# Patient Record
Sex: Male | Born: 1950 | Race: White | Hispanic: No | Marital: Married | Smoking: Never smoker
Health system: Southern US, Community
[De-identification: ages and names within clinical notes are randomized; demographics above are authoritative.]

## PROBLEM LIST (undated history)

## (undated) DIAGNOSIS — I10 Essential (primary) hypertension: Secondary | ICD-10-CM

## (undated) DIAGNOSIS — F32A Depression, unspecified: Secondary | ICD-10-CM

## (undated) DIAGNOSIS — E785 Hyperlipidemia, unspecified: Secondary | ICD-10-CM

## (undated) DIAGNOSIS — Z87442 Personal history of urinary calculi: Secondary | ICD-10-CM

## (undated) DIAGNOSIS — F329 Major depressive disorder, single episode, unspecified: Secondary | ICD-10-CM

## (undated) DIAGNOSIS — G5600 Carpal tunnel syndrome, unspecified upper limb: Secondary | ICD-10-CM

## (undated) DIAGNOSIS — K5792 Diverticulitis of intestine, part unspecified, without perforation or abscess without bleeding: Secondary | ICD-10-CM

## (undated) HISTORY — DX: Hyperlipidemia, unspecified: E78.5

## (undated) HISTORY — PX: ELBOW BURSA SURGERY: SHX615

---

## 1978-12-24 HISTORY — PX: SHOULDER ARTHROSCOPY: SHX128

## 1994-12-24 HISTORY — PX: ANKLE RECONSTRUCTION: SHX1151

## 1999-04-06 ENCOUNTER — Encounter: Payer: Self-pay | Admitting: General Surgery

## 1999-04-07 ENCOUNTER — Ambulatory Visit (HOSPITAL_COMMUNITY): Admission: RE | Admit: 1999-04-07 | Discharge: 1999-04-07 | Payer: Self-pay | Admitting: General Surgery

## 1999-11-22 ENCOUNTER — Emergency Department (HOSPITAL_COMMUNITY): Admission: EM | Admit: 1999-11-22 | Discharge: 1999-11-22 | Payer: Self-pay | Admitting: Podiatry

## 1999-11-22 ENCOUNTER — Encounter: Payer: Self-pay | Admitting: *Deleted

## 2000-03-08 ENCOUNTER — Ambulatory Visit: Admission: RE | Admit: 2000-03-08 | Discharge: 2000-03-08 | Payer: Self-pay

## 2000-12-07 ENCOUNTER — Emergency Department (HOSPITAL_COMMUNITY): Admission: EM | Admit: 2000-12-07 | Discharge: 2000-12-08 | Payer: Self-pay | Admitting: Emergency Medicine

## 2000-12-11 ENCOUNTER — Ambulatory Visit (HOSPITAL_COMMUNITY): Admission: RE | Admit: 2000-12-11 | Discharge: 2000-12-11 | Payer: Self-pay | Admitting: General Surgery

## 2000-12-20 ENCOUNTER — Ambulatory Visit (HOSPITAL_COMMUNITY): Admission: RE | Admit: 2000-12-20 | Discharge: 2000-12-20 | Payer: Self-pay | Admitting: *Deleted

## 2001-11-22 ENCOUNTER — Emergency Department (HOSPITAL_COMMUNITY): Admission: EM | Admit: 2001-11-22 | Discharge: 2001-11-22 | Payer: Self-pay

## 2002-11-12 ENCOUNTER — Encounter: Payer: Self-pay | Admitting: Emergency Medicine

## 2002-11-12 ENCOUNTER — Emergency Department (HOSPITAL_COMMUNITY): Admission: EM | Admit: 2002-11-12 | Discharge: 2002-11-12 | Payer: Self-pay | Admitting: Emergency Medicine

## 2004-02-07 ENCOUNTER — Inpatient Hospital Stay (HOSPITAL_COMMUNITY): Admission: EM | Admit: 2004-02-07 | Discharge: 2004-02-09 | Payer: Self-pay | Admitting: Oncology

## 2004-09-13 ENCOUNTER — Ambulatory Visit (HOSPITAL_COMMUNITY): Admission: RE | Admit: 2004-09-13 | Discharge: 2004-09-13 | Payer: Self-pay | Admitting: Family Medicine

## 2005-09-10 ENCOUNTER — Emergency Department (HOSPITAL_COMMUNITY): Admission: EM | Admit: 2005-09-10 | Discharge: 2005-09-10 | Payer: Self-pay | Admitting: Emergency Medicine

## 2006-04-10 ENCOUNTER — Encounter: Admission: RE | Admit: 2006-04-10 | Discharge: 2006-04-10 | Payer: Self-pay | Admitting: Gastroenterology

## 2011-06-28 ENCOUNTER — Emergency Department (HOSPITAL_COMMUNITY): Payer: 59

## 2011-06-28 ENCOUNTER — Emergency Department (HOSPITAL_COMMUNITY)
Admission: EM | Admit: 2011-06-28 | Discharge: 2011-06-28 | Disposition: A | Discharge status: Home / Self Care | Payer: 59 | Attending: Emergency Medicine | Admitting: Emergency Medicine

## 2011-06-28 DIAGNOSIS — R5381 Other malaise: Secondary | ICD-10-CM | POA: Insufficient documentation

## 2011-06-28 DIAGNOSIS — R6884 Jaw pain: Secondary | ICD-10-CM | POA: Insufficient documentation

## 2011-06-28 DIAGNOSIS — R42 Dizziness and giddiness: Secondary | ICD-10-CM | POA: Insufficient documentation

## 2011-06-28 DIAGNOSIS — R110 Nausea: Secondary | ICD-10-CM | POA: Insufficient documentation

## 2011-06-28 DIAGNOSIS — I10 Essential (primary) hypertension: Secondary | ICD-10-CM | POA: Insufficient documentation

## 2011-06-28 DIAGNOSIS — R5383 Other fatigue: Secondary | ICD-10-CM | POA: Insufficient documentation

## 2011-06-28 DIAGNOSIS — R61 Generalized hyperhidrosis: Secondary | ICD-10-CM | POA: Insufficient documentation

## 2011-06-28 LAB — CBC
MCV: 88.1 fL (ref 78.0–100.0)
Platelets: 198 10*3/uL (ref 150–400)
RDW: 13.6 % (ref 11.5–15.5)
WBC: 7.5 10*3/uL (ref 4.0–10.5)

## 2011-06-28 LAB — CK TOTAL AND CKMB (NOT AT ARMC): Relative Index: 1.9 (ref 0.0–2.5)

## 2011-06-28 LAB — COMPREHENSIVE METABOLIC PANEL
Albumin: 3.9 g/dL (ref 3.5–5.2)
Alkaline Phosphatase: 83 U/L (ref 39–117)
BUN: 16 mg/dL (ref 6–23)
CO2: 28 mEq/L (ref 19–32)
Chloride: 100 mEq/L (ref 96–112)
GFR calc non Af Amer: >60 mL/min (ref >60)
Glucose, Bld: 103 mg/dL — ABNORMAL HIGH (ref 70–99)
Potassium: 3.9 mEq/L (ref 3.5–5.1)
Total Bilirubin: 0.6 mg/dL (ref 0.3–1.2)

## 2011-06-28 LAB — DIFFERENTIAL
Basophils Relative: 0 % (ref 0–1)
Lymphocytes Relative: 12 % (ref 12–46)
Monocytes Absolute: 0.4 10*3/uL (ref 0.1–1.0)
Monocytes Relative: 6 % (ref 3–12)

## 2011-12-21 ENCOUNTER — Encounter (HOSPITAL_COMMUNITY): Payer: Self-pay | Admitting: *Deleted

## 2011-12-21 NOTE — Progress Notes (Signed)
Phone pts home number -left message.

## 2011-12-22 NOTE — H&P (Signed)
Michael Melendez is an 60 y.o. male.   Chief Complaint: RIGHT ELBOW PAIN AND HAND NUMBNESS HPI: GREATER THAN ONE YEAR OF PAIN AND NUMBNESS IN HAND PT ELECTS TO  UNDERGO BELOW PROCEDURES. PT CONCERNED ABOUT NUMBNESS AND DECLINE OF FUNCTION OF THE HAND. +EMG/NCV STUDIES  Past Medical History  Diagnosis Date  . Hypertension   . Gout   . Chronic kidney disease     stones  . Arthritis     r elbow    Past Surgical History  Procedure Date  . Ankle reconstruction 1996  . Shoulder arthroscopy 1980  . Elbow bursa surgery     Family History  Problem Relation Age of Onset  . Anesthesia problems Neg Hx    Social History:  reports that he has never smoked. He does not have any smokeless tobacco history on file. He reports that he drinks about 4.2 ounces of alcohol per week. He reports that he does not use illicit drugs.  Allergies: No Known Allergies  No current facility-administered medications on file as of .   No current outpatient prescriptions on file as of .    No results found for this or any previous visit (from the past 48 hour(s)). No results found.  NO RECENT ILLNESSES OR HOSPITALIZATIONS  There were no vitals taken for this visit. General Appearance:  Alert, cooperative, no distress, appears stated age  Head:  Normocephalic, without obvious abnormality, atraumatic  Eyes:  Pupils equal, conjunctiva/corneas clear,         Throat: Lips, mucosa, and tongue normal; teeth and gums normal  Neck: No visible masses     Lungs:   respirations unlabored  Chest Wall:  No tenderness or deformity  Heart:  Regular rate and rhythm,  Abdomen:   Soft, non-tender,         Extremities: RIGHT ELBOW HEALED ARTHROSCOPIC PORTAL SITES. GOOD ELBOW MOBILITY, SLIGHT ULNAR NERVE SUBLUXATION WITH ELBOW FLEXION. FINGERS WARM WELL PERFUSED. GOOD HAND INTRNISIC STRENGTH.  GOOD MUSCLE TONE  Pulses: 2+ and symmetric  Skin: Skin color, texture, turgor normal, no rashes or lesions     Neurologic:  Normal    Assessment/Plan RIGHT HAND CARPAL TUNNEL RIGHT ELBOW CUBITAL TUNNEL  RIGHT HAND CARPAL TUNNEL RELEASE RIGHT ELBOW ULNAR NERVE RELEASE AND OR TRANSPOSITION  R/B/A DISCUSSED WITH PT IN OFFICE.  PT VOICED UNDERSTANDING OF PLAN CONSENT SIGNED DAY OF SURGERY PT SEEN AND EXAMINED PRIOR TO OPERATIVE PROCEDURE/DAY OF SURGERY SITE MARKED. QUESTIONS ANSWERED WILL GO HOME FOLLOWING SURGERY  Sharma Covert 12/22/2011, 9:25 AM

## 2011-12-23 MED ORDER — CEFAZOLIN SODIUM-DEXTROSE 2-3 GM-% IV SOLR
2.0000 g | INTRAVENOUS | Status: DC
  Filled 2011-12-23: qty 50

## 2011-12-24 ENCOUNTER — Ambulatory Visit (HOSPITAL_COMMUNITY)
Admission: RE | Admit: 2011-12-24 | Discharge: 2011-12-24 | Disposition: A | Discharge status: Home / Self Care | Payer: 59 | Source: Ambulatory Visit | Attending: Orthopedic Surgery | Admitting: Orthopedic Surgery

## 2011-12-24 ENCOUNTER — Encounter (HOSPITAL_COMMUNITY)
Admission: RE | Disposition: A | Discharge status: Home / Self Care | Payer: Self-pay | Source: Ambulatory Visit | Attending: Orthopedic Surgery

## 2011-12-24 ENCOUNTER — Other Ambulatory Visit: Payer: Self-pay

## 2011-12-24 ENCOUNTER — Encounter (HOSPITAL_COMMUNITY): Payer: Self-pay | Admitting: Certified Registered"

## 2011-12-24 DIAGNOSIS — I10 Essential (primary) hypertension: Secondary | ICD-10-CM | POA: Insufficient documentation

## 2011-12-24 DIAGNOSIS — G5620 Lesion of ulnar nerve, unspecified upper limb: Secondary | ICD-10-CM | POA: Insufficient documentation

## 2011-12-24 DIAGNOSIS — M109 Gout, unspecified: Secondary | ICD-10-CM | POA: Insufficient documentation

## 2011-12-24 DIAGNOSIS — G5600 Carpal tunnel syndrome, unspecified upper limb: Secondary | ICD-10-CM | POA: Insufficient documentation

## 2011-12-24 DIAGNOSIS — Z538 Procedure and treatment not carried out for other reasons: Secondary | ICD-10-CM | POA: Insufficient documentation

## 2011-12-24 HISTORY — DX: Essential (primary) hypertension: I10

## 2011-12-24 LAB — BASIC METABOLIC PANEL
BUN: 14 mg/dL (ref 6–23)
CO2: 25 mEq/L (ref 19–32)
Chloride: 105 mEq/L (ref 96–112)
Creatinine, Ser: 1.2 mg/dL (ref 0.50–1.35)
Glucose, Bld: 103 mg/dL — ABNORMAL HIGH (ref 70–99)
Potassium: 4.3 mEq/L (ref 3.5–5.1)

## 2011-12-24 LAB — CBC
HCT: 38.8 % — ABNORMAL LOW (ref 39.0–52.0)
Hemoglobin: 13.6 g/dL (ref 13.0–17.0)
MCV: 89.8 fL (ref 78.0–100.0)
RDW: 13.1 % (ref 11.5–15.5)
WBC: 4.4 10*3/uL (ref 4.0–10.5)

## 2011-12-24 LAB — SURGICAL PCR SCREEN: Staphylococcus aureus: NEGATIVE

## 2011-12-24 SURGERY — CARPAL TUNNEL RELEASE
Anesthesia: General | Laterality: Right

## 2011-12-24 MED ORDER — CHLORHEXIDINE GLUCONATE 4 % EX LIQD: 60.0000 mL | Freq: Once | CUTANEOUS | Status: DC

## 2011-12-24 MED ORDER — MUPIROCIN 2 % EX OINT
TOPICAL_OINTMENT | CUTANEOUS | Status: AC
  Administered 2011-12-24: 1 via NASAL
  Filled 2011-12-24: qty 22

## 2011-12-24 SURGICAL SUPPLY — 40 items
BANDAGE ELASTIC 3 VELCRO ST LF (GAUZE/BANDAGES/DRESSINGS) ×4 IMPLANT
BANDAGE ELASTIC 4 VELCRO ST LF (GAUZE/BANDAGES/DRESSINGS) ×2 IMPLANT
BANDAGE GAUZE ELAST BULKY 4 IN (GAUZE/BANDAGES/DRESSINGS) ×2 IMPLANT
CLOTH BEACON ORANGE TIMEOUT ST (SAFETY) ×2 IMPLANT
CORDS BIPOLAR (ELECTRODE) ×2 IMPLANT
COVER SURGICAL LIGHT HANDLE (MISCELLANEOUS) ×2 IMPLANT
CUFF TOURNIQUET SINGLE 18IN (TOURNIQUET CUFF) ×2 IMPLANT
CUFF TOURNIQUET SINGLE 24IN (TOURNIQUET CUFF) IMPLANT
DRAPE SURG 17X23 STRL (DRAPES) ×2 IMPLANT
EVACUATOR 1/8 PVC DRAIN (DRAIN) IMPLANT
GAUZE XEROFORM 1X8 LF (GAUZE/BANDAGES/DRESSINGS) ×2 IMPLANT
GLOVE BIOGEL PI IND STRL 8.5 (GLOVE) ×1 IMPLANT
GLOVE BIOGEL PI INDICATOR 8.5 (GLOVE) ×1
GLOVE SURG ORTHO 8.0 STRL STRW (GLOVE) ×2 IMPLANT
GOWN PREVENTION PLUS XLARGE (GOWN DISPOSABLE) ×2 IMPLANT
GOWN STRL NON-REIN LRG LVL3 (GOWN DISPOSABLE) ×4 IMPLANT
KIT BASIN OR (CUSTOM PROCEDURE TRAY) ×2 IMPLANT
KIT ROOM TURNOVER OR (KITS) ×2 IMPLANT
LOOP VESSEL MAXI BLUE (MISCELLANEOUS) IMPLANT
NDL HYPO 25GX1X1/2 BEV (NEEDLE) IMPLANT
NEEDLE HYPO 25GX1X1/2 BEV (NEEDLE) IMPLANT
NS IRRIG 1000ML POUR BTL (IV SOLUTION) ×2 IMPLANT
PACK ORTHO EXTREMITY (CUSTOM PROCEDURE TRAY) ×2 IMPLANT
PAD ARMBOARD 7.5X6 YLW CONV (MISCELLANEOUS) ×4 IMPLANT
PAD CAST 4YDX4 CTTN HI CHSV (CAST SUPPLIES) ×2 IMPLANT
PADDING CAST COTTON 4X4 STRL (CAST SUPPLIES) ×4
SOAP 2 % CHG 4 OZ (WOUND CARE) ×2 IMPLANT
SPONGE GAUZE 4X4 12PLY (GAUZE/BANDAGES/DRESSINGS) ×2 IMPLANT
SUCTION FRAZIER TIP 10 FR DISP (SUCTIONS) IMPLANT
SUT PROLENE 4 0 PS 2 18 (SUTURE) ×2 IMPLANT
SUT VIC AB 2-0 CT1 27 (SUTURE)
SUT VIC AB 2-0 CT1 TAPERPNT 27 (SUTURE) IMPLANT
SUT VIC AB 3-0 FS2 27 (SUTURE) IMPLANT
SYR CONTROL 10ML LL (SYRINGE) IMPLANT
SYSTEM CHEST DRAIN TLS 7FR (DRAIN) IMPLANT
TOWEL OR 17X24 6PK STRL BLUE (TOWEL DISPOSABLE) ×2 IMPLANT
TOWEL OR 17X26 10 PK STRL BLUE (TOWEL DISPOSABLE) ×2 IMPLANT
TUBE CONNECTING 12X1/4 (SUCTIONS) IMPLANT
UNDERPAD 30X30 INCONTINENT (UNDERPADS AND DIAPERS) ×2 IMPLANT
WATER STERILE IRR 1000ML POUR (IV SOLUTION) ×2 IMPLANT

## 2011-12-24 NOTE — Anesthesia Preprocedure Evaluation (Addendum)
Anesthesia Evaluation  Patient identified by MRN, date of birth, ID band Patient awake    Reviewed: Allergy & Precautions, H&P , NPO status , Patient's Chart, lab work & pertinent test results  Airway Mallampati: I TM Distance: >3 FB Neck ROM: Full    Dental  (+) Teeth Intact and Dental Advisory Given   Pulmonary neg pulmonary ROS,    Pulmonary exam normal       Cardiovascular hypertension, Pt. on medications regular Normal    Neuro/Psych Negative Neurological ROS  Negative Psych ROS   GI/Hepatic negative GI ROS, Neg liver ROS,   Endo/Other  Negative Endocrine ROS  Renal/GU negative Renal ROS  Genitourinary negative   Musculoskeletal   Abdominal   Peds  Hematology   Anesthesia Other Findings   Reproductive/Obstetrics                         Anesthesia Physical Anesthesia Plan  ASA: II  Anesthesia Plan: General   Post-op Pain Management:    Induction: Intravenous  Airway Management Planned: LMA  Additional Equipment:   Intra-op Plan:   Post-operative Plan: Extubation in OR  Informed Consent: I have reviewed the patients History and Physical, chart, labs and discussed the procedure including the risks, benefits and alternatives for the proposed anesthesia with the patient or authorized representative who has indicated his/her understanding and acceptance.     Plan Discussed with: CRNA, Anesthesiologist and Surgeon  Anesthesia Plan Comments:         Anesthesia Quick Evaluation

## 2011-12-26 ENCOUNTER — Encounter (HOSPITAL_COMMUNITY): Payer: Self-pay | Admitting: Orthopedic Surgery

## 2014-04-19 ENCOUNTER — Other Ambulatory Visit: Payer: Self-pay | Admitting: Gastroenterology

## 2014-07-05 ENCOUNTER — Encounter (HOSPITAL_COMMUNITY): Payer: Self-pay | Admitting: Pharmacy Technician

## 2014-07-05 ENCOUNTER — Encounter (HOSPITAL_COMMUNITY): Payer: Self-pay | Admitting: *Deleted

## 2014-07-27 ENCOUNTER — Encounter (HOSPITAL_COMMUNITY): Payer: 59 | Admitting: Anesthesiology

## 2014-07-27 ENCOUNTER — Encounter (HOSPITAL_COMMUNITY)
Admission: RE | Disposition: A | Discharge status: Home / Self Care | Payer: Self-pay | Source: Ambulatory Visit | Attending: Gastroenterology

## 2014-07-27 ENCOUNTER — Ambulatory Visit (HOSPITAL_COMMUNITY): Payer: 59 | Admitting: Anesthesiology

## 2014-07-27 ENCOUNTER — Ambulatory Visit (HOSPITAL_COMMUNITY)
Admission: RE | Admit: 2014-07-27 | Discharge: 2014-07-27 | Disposition: A | Discharge status: Home / Self Care | Payer: 59 | Source: Ambulatory Visit | Attending: Gastroenterology | Admitting: Gastroenterology

## 2014-07-27 ENCOUNTER — Encounter (HOSPITAL_COMMUNITY): Payer: Self-pay | Admitting: *Deleted

## 2014-07-27 DIAGNOSIS — Z1211 Encounter for screening for malignant neoplasm of colon: Secondary | ICD-10-CM | POA: Insufficient documentation

## 2014-07-27 DIAGNOSIS — I10 Essential (primary) hypertension: Secondary | ICD-10-CM | POA: Insufficient documentation

## 2014-07-27 DIAGNOSIS — M109 Gout, unspecified: Secondary | ICD-10-CM | POA: Insufficient documentation

## 2014-07-27 DIAGNOSIS — K620 Anal polyp: Secondary | ICD-10-CM | POA: Insufficient documentation

## 2014-07-27 DIAGNOSIS — E7800 Pure hypercholesterolemia, unspecified: Secondary | ICD-10-CM | POA: Insufficient documentation

## 2014-07-27 DIAGNOSIS — K621 Rectal polyp: Secondary | ICD-10-CM

## 2014-07-27 HISTORY — DX: Personal history of urinary calculi: Z87.442

## 2014-07-27 HISTORY — DX: Depression, unspecified: F32.A

## 2014-07-27 HISTORY — DX: Major depressive disorder, single episode, unspecified: F32.9

## 2014-07-27 HISTORY — DX: Carpal tunnel syndrome, unspecified upper limb: G56.00

## 2014-07-27 HISTORY — PX: COLONOSCOPY WITH PROPOFOL: SHX5780

## 2014-07-27 SURGERY — COLONOSCOPY WITH PROPOFOL
Anesthesia: Monitor Anesthesia Care

## 2014-07-27 MED ORDER — LIDOCAINE HCL (CARDIAC) 20 MG/ML IV SOLN
INTRAVENOUS | Status: AC
  Filled 2014-07-27: qty 5

## 2014-07-27 MED ORDER — PROPOFOL INFUSION 10 MG/ML OPTIME
INTRAVENOUS | Status: DC | PRN
  Administered 2014-07-27: 120 ug/kg/min via INTRAVENOUS

## 2014-07-27 MED ORDER — LACTATED RINGERS IV SOLN
INTRAVENOUS | Status: DC
Start: 2014-07-27 — End: 2014-07-27

## 2014-07-27 MED ORDER — PROPOFOL 10 MG/ML IV EMUL
INTRAVENOUS | Status: DC | PRN
  Administered 2014-07-27: 40 mg via INTRAVENOUS

## 2014-07-27 MED ORDER — PROPOFOL 10 MG/ML IV BOLUS
INTRAVENOUS | Status: AC
  Filled 2014-07-27: qty 20

## 2014-07-27 MED ORDER — LACTATED RINGERS IV SOLN
INTRAVENOUS | Status: DC | PRN
  Administered 2014-07-27: 08:00:00 via INTRAVENOUS

## 2014-07-27 SURGICAL SUPPLY — 22 items

## 2014-07-27 NOTE — Transfer of Care (Signed)
Immediate Anesthesia Transfer of Care Note  Patient: Michael Melendez  Procedure(s) Performed: Procedure(s): COLONOSCOPY WITH PROPOFOL (N/A)  Patient Location: PACU  Anesthesia Type:MAC  Level of Consciousness: awake, alert  and oriented  Airway & Oxygen Therapy: Patient Spontanous Breathing and Patient connected to face mask oxygen  Post-op Assessment: Report given to PACU RN and Post -op Vital signs reviewed and stable  Post vital signs: Reviewed and stable  Complications: No apparent anesthesia complications

## 2014-07-27 NOTE — Discharge Instructions (Signed)
Colonoscopy, Care After °These instructions give you information on caring for yourself after your procedure. Your doctor may also give you more specific instructions. Call your doctor if you have any problems or questions after your procedure. °HOME CARE °· Do not drive for 24 hours. °· Do not sign important papers or use machinery for 24 hours. °· You may shower. °· You may go back to your usual activities, but go slower for the first 24 hours. °· Take rest breaks often during the first 24 hours. °· Walk around or use warm packs on your belly (abdomen) if you have belly cramping or gas. °· Drink enough fluids to keep your pee (urine) clear or pale yellow. °· Resume your normal diet. Avoid heavy or fried foods. °· Avoid drinking alcohol for 24 hours or as told by your doctor. °· Only take medicines as told by your doctor. °If a tissue sample (biopsy) was taken during the procedure:  °· Do not take aspirin or blood thinners for 7 days, or as told by your doctor. °· Do not drink alcohol for 7 days, or as told by your doctor. °· Eat soft foods for the first 24 hours. °GET HELP IF: °You still have a small amount of blood in your poop (stool) 2-3 days after the procedure. °GET HELP RIGHT AWAY IF: °· You have more than a small amount of blood in your poop. °· You see clumps of tissue (blood clots) in your poop. °· Your belly is puffy (swollen). °· You feel sick to your stomach (nauseous) or throw up (vomit). °· You have a fever. °· You have belly pain that gets worse and medicine does not help. °MAKE SURE YOU: °· Understand these instructions. °· Will watch your condition. °· Will get help right away if you are not doing well or get worse. °Document Released: 01/12/2011 Document Revised: 12/15/2013 Document Reviewed: 08/17/2013 °[employer]® Patient Information ©2015 [employer]. This information is not intended to replace advice given to you by your health care provider. Make sure you discuss any questions you have with  your health care provider. ° °

## 2014-07-27 NOTE — Anesthesia Postprocedure Evaluation (Signed)
  Anesthesia Post-op Note  Patient: Michael Melendez  Procedure(s) Performed: Procedure(s): COLONOSCOPY WITH PROPOFOL (N/A)  Patient Location: PACU  Anesthesia Type:MAC  Level of Consciousness: awake, alert  and oriented  Airway and Oxygen Therapy: Patient Spontanous Breathing  Post-op Pain: none  Post-op Assessment: Post-op Vital signs reviewed  Post-op Vital Signs: Reviewed  Last Vitals:  Filed Vitals:   07/27/14 0930  BP: 126/87  Pulse: 54  Temp:   Resp: 15    Complications: No apparent anesthesia complications

## 2014-07-27 NOTE — Anesthesia Preprocedure Evaluation (Addendum)
Anesthesia Evaluation  Patient identified by MRN, date of birth, ID band Patient awake    Reviewed: Allergy & Precautions, H&P , NPO status , Patient's Chart, lab work & pertinent test results  History of Anesthesia Complications Negative for: history of anesthetic complications  Airway Mallampati: II TM Distance: >3 FB Neck ROM: Full    Dental  (+) Teeth Intact, Dental Advisory Given   Pulmonary neg pulmonary ROS,  breath sounds clear to auscultation        Cardiovascular hypertension, negative cardio ROS  Rhythm:Regular Rate:Normal     Neuro/Psych PSYCHIATRIC DISORDERS Depression negative neurological ROS     GI/Hepatic negative GI ROS, Neg liver ROS,   Endo/Other  negative endocrine ROS  Renal/GU negative Renal ROS  negative genitourinary   Musculoskeletal negative musculoskeletal ROS (+)   Abdominal   Peds  Hematology negative hematology ROS (+)   Anesthesia Other Findings   Reproductive/Obstetrics negative OB ROS                          Anesthesia Physical Anesthesia Plan  ASA: I  Anesthesia Plan: MAC   Post-op Pain Management:    Induction: Intravenous  Airway Management Planned: Simple Face Mask  Additional Equipment: None  Intra-op Plan:   Post-operative Plan:   Informed Consent: I have reviewed the patients History and Physical, chart, labs and discussed the procedure including the risks, benefits and alternatives for the proposed anesthesia with the patient or authorized representative who has indicated his/her understanding and acceptance.   Dental advisory given  Plan Discussed with: CRNA  Anesthesia Plan Comments:        Anesthesia Quick Evaluation

## 2014-07-27 NOTE — Op Note (Signed)
Procedure: Screening colonoscopy. Normal screening colonoscopy performed 12/20/2000  Endoscopist: Earle Gell  Premedication: Propofol administered by anesthesia  Procedure: The patient was placed in the left lateral decubitus position. Anal inspection and digital rectal exam were normal. The Pentax pediatric colonoscope was introduced into the rectum and advanced to the cecum. A normal-appearing ileocecal valve and appendiceal orifice were identified. Colonic preparation for the exam today was good.  Rectum. A 5 mm sessile polyp was removed from the proximal rectum with the cold snare and submitted for pathological interpretation. Retroflexed view of the distal rectum was normal  Sigmoid colon and descending colon. Left colonic diverticulosis  Splenic flexure. Normal  Transverse colon. Normal  Hepatic flexure. Normal  Ascending colon. Normal  Cecum and ileocecal valve. Normal  Assessment: A 5 mm sessile polyp was removed from the proximal rectum with the cold snare and submitted for pathological interpretation; otherwise normal  Recommendation: If rectal polyp returns neoplastic pathologically, the patient should undergo a surveillance colonoscopy in 5 years. If the polyp returns nonneoplastic pathologically, she should undergo a repeat screening colonoscopy in 10 years

## 2014-07-27 NOTE — H&P (Signed)
  Procedure: Screening colonoscopy. Normal screening colonoscopy performed 12/20/2000  History: The patient is a 63 year old male born May 30, 1951. He underwent a normal baseline screening colonoscopy on 12/20/2000. He is scheduled to undergo a repeat screening colonoscopy today.  Medication allergies: None  Past medical and surgical history: Ankle surgery. Right elbow surgery. Rotator cuff surgery. Hypercholesterolemia. Kidney stones. Gout. Hypertension.  Exam: The patient is alert and lying comfortably on the endoscopy stretcher. Lungs are clear to auscultation. Cardiac exam reveals a regular rhythm. Abdomen is soft and nontender to palpation.  Plan: Proceed with screening colonoscopy

## 2014-07-28 ENCOUNTER — Encounter (HOSPITAL_COMMUNITY): Payer: Self-pay | Admitting: Gastroenterology

## 2015-01-06 ENCOUNTER — Encounter (HOSPITAL_COMMUNITY): Payer: Self-pay | Admitting: Orthopedic Surgery

## 2015-08-04 ENCOUNTER — Ambulatory Visit: Payer: Medicare Other | Admitting: Podiatry

## 2015-12-07 ENCOUNTER — Ambulatory Visit (INDEPENDENT_AMBULATORY_CARE_PROVIDER_SITE_OTHER): Payer: Medicare Other | Admitting: Podiatry

## 2015-12-07 ENCOUNTER — Encounter: Payer: Self-pay | Admitting: Podiatry

## 2015-12-07 VITALS — BP 157/87 | HR 58 | Resp 12

## 2015-12-07 DIAGNOSIS — B351 Tinea unguium: Secondary | ICD-10-CM | POA: Diagnosis not present

## 2015-12-07 NOTE — Progress Notes (Addendum)
   Subjective:    Patient ID: Michael Melendez, male    DOB: October 25, 1951, 64 y.o.   MRN: [id number]  HPI this patient presents the office with chief complaint of a discolored and gross toenail left big toe. He states he has lived with this nail for years and he has no pain or discomfort from the nails is at the point, He wants to determine what to do cosmetically to help to eliminate the nail. He states he has been on Lamisil previously and there was no benefit. He does relate that the nail has been injured and has fallen off at least 3 times .  He presents to the office to discuss his toenail  The patient presents here with left great and 2nd toe discoloration.  Review of Systems  All other systems reviewed and are negative.      Objective:   Physical Exam GENERAL APPEARANCE: Alert, conversant. Appropriately groomed. No acute distress.  VASCULAR: Pedal pulses palpable at  [facility] and PT bilateral.  Capillary refill time is immediate to all digits,  Normal temperature gradient.  Digital hair growth is present bilateral  NEUROLOGIC: sensation is normal to 5.07 monofilament at 5/5 sites bilateral.  Light touch is intact bilateral, Muscle strength normal.  MUSCULOSKELETAL: acceptable muscle strength, tone and stability bilateral.  Intrinsic muscluature intact bilateral.  Rectus appearance of foot and digits noted bilateral.   DERMATOLOGIC: skin color, texture, and turgor are within normal limits.  No preulcerative lesions or ulcers  are seen, no interdigital maceration noted.  No open lesions present.  Digital nails are asymptomatic. No drainage noted. NAILS  Yellow, flaking disfigured toenail on left nail bed which grows about 40% from nail fold. No signs of redness or swelling or pain noted.        Assessment & Plan:  Onychomycosis Left hallux.  IE>  Discussed conservative vs. Surgical treatment of the nail.  RTC prn  Gardiner Barefoot DPM

## 2017-01-20 ENCOUNTER — Emergency Department (HOSPITAL_COMMUNITY): Payer: Medicare Other

## 2017-01-20 ENCOUNTER — Emergency Department (HOSPITAL_COMMUNITY)
Admission: EM | Admit: 2017-01-20 | Discharge: 2017-01-20 | Disposition: A | Discharge status: Home / Self Care | Payer: Medicare Other | Attending: Emergency Medicine | Admitting: Emergency Medicine

## 2017-01-20 ENCOUNTER — Encounter (HOSPITAL_COMMUNITY): Payer: Self-pay | Admitting: Emergency Medicine

## 2017-01-20 DIAGNOSIS — N202 Calculus of kidney with calculus of ureter: Secondary | ICD-10-CM | POA: Diagnosis not present

## 2017-01-20 DIAGNOSIS — I10 Essential (primary) hypertension: Secondary | ICD-10-CM | POA: Insufficient documentation

## 2017-01-20 DIAGNOSIS — N200 Calculus of kidney: Secondary | ICD-10-CM

## 2017-01-20 DIAGNOSIS — R109 Unspecified abdominal pain: Secondary | ICD-10-CM | POA: Diagnosis present

## 2017-01-20 DIAGNOSIS — R1031 Right lower quadrant pain: Secondary | ICD-10-CM | POA: Diagnosis not present

## 2017-01-20 LAB — COMPREHENSIVE METABOLIC PANEL
ALT: 25 U/L (ref 17–63)
AST: 29 U/L (ref 15–41)
Albumin: 4.5 g/dL (ref 3.5–5.0)
Alkaline Phosphatase: 70 U/L (ref 38–126)
Anion gap: 8 (ref 5–15)
BILIRUBIN TOTAL: 1.4 mg/dL — AB (ref 0.3–1.2)
BUN: 20 mg/dL (ref 6–20)
CALCIUM: 9.6 mg/dL (ref 8.9–10.3)
CO2: 25 mmol/L (ref 22–32)
CREATININE: 1.63 mg/dL — AB (ref 0.61–1.24)
Chloride: 107 mmol/L (ref 101–111)
GFR calc Af Amer: 49 mL/min — ABNORMAL LOW (ref >60)
GFR, EST NON AFRICAN AMERICAN: 42 mL/min — AB (ref >60)
GLUCOSE: 116 mg/dL — AB (ref 65–99)
Potassium: 3.9 mmol/L (ref 3.5–5.1)
Sodium: 140 mmol/L (ref 135–145)
Total Protein: 7.3 g/dL (ref 6.5–8.1)

## 2017-01-20 LAB — URINALYSIS, ROUTINE W REFLEX MICROSCOPIC
BILIRUBIN URINE: NEGATIVE
GLUCOSE, UA: NEGATIVE mg/dL
KETONES UR: NEGATIVE mg/dL
LEUKOCYTES UA: NEGATIVE
NITRITE: NEGATIVE
PH: 5 (ref 5.0–8.0)
PROTEIN: NEGATIVE mg/dL
Specific Gravity, Urine: 1.018 (ref 1.005–1.030)

## 2017-01-20 LAB — CBC
HEMATOCRIT: 39.7 % (ref 39.0–52.0)
HEMOGLOBIN: 14 g/dL (ref 13.0–17.0)
MCH: 30.9 pg (ref 26.0–34.0)
MCHC: 35.3 g/dL (ref 30.0–36.0)
MCV: 87.6 fL (ref 78.0–100.0)
Platelets: 211 10*3/uL (ref 150–400)
RBC: 4.53 MIL/uL (ref 4.22–5.81)
RDW: 13 % (ref 11.5–15.5)
WBC: 4.4 10*3/uL (ref 4.0–10.5)

## 2017-01-20 LAB — LIPASE, BLOOD: LIPASE: 28 U/L (ref 11–51)

## 2017-01-20 MED ORDER — TAMSULOSIN HCL 0.4 MG PO CAPS: 0.4000 mg | ORAL_CAPSULE | Freq: Every day | ORAL | 0 refills | Status: DC

## 2017-01-20 MED ORDER — OXYCODONE HCL 5 MG PO TABS: 5.0000 mg | ORAL_TABLET | Freq: Four times a day (QID) | ORAL | 0 refills | Status: DC | PRN

## 2017-01-20 MED ORDER — KETOROLAC TROMETHAMINE 30 MG/ML IJ SOLN
30.0000 mg | Freq: Once | INTRAMUSCULAR | Status: AC
  Administered 2017-01-20: 30 mg via INTRAVENOUS
  Filled 2017-01-20: qty 1

## 2017-01-20 MED ORDER — HYDROCODONE-ACETAMINOPHEN 5-325 MG PO TABS: 2.0000 | ORAL_TABLET | ORAL | 0 refills | Status: DC | PRN

## 2017-01-20 MED ORDER — ONDANSETRON HCL 4 MG PO TABS: 4.0000 mg | ORAL_TABLET | Freq: Four times a day (QID) | ORAL | 0 refills | Status: DC

## 2017-01-20 NOTE — ED Notes (Signed)
Pt given urinal and made aware of need for urine specimen 

## 2017-01-20 NOTE — ED Provider Notes (Signed)
Emergency Department Provider Note   I have reviewed the triage vital signs and the nursing notes.   HISTORY  Chief Complaint Flank Pain   HPI Michael Melendez is a 66 y.o. male with PMH of gout and history of kidney stones presents to the emergency department for evaluation of sudden onset right flank pain. Patient states he's had some mild discomfort in this area for the past several weeks. Today he was playing racquetball when his pain suddenly worsened. He felt as if it was radiating from his back around to his flank. He has a past history of kidney stones states the pain was similar but not quite as severe as prior episodes. Patient reports a history of multiple stones in his 32s one of which required lithotripsy and his right ureter was damaged during the procedure. He denies any hematuria. No fevers or chills. No chest pain or difficulty breathing. No exacerbating or alleviating factors.  Past Medical History:  Diagnosis Date  . Carpal tunnel syndrome    on right pt says surgery 12-24-11 never occurred  . Depression   . Gout   . History of kidney stones    none in years  . Hypertension     There are no active problems to display for this patient.   Past Surgical History:  Procedure Laterality Date  . ANKLE RECONSTRUCTION Bilateral 1996  . COLONOSCOPY WITH PROPOFOL N/A 07/27/2014   Procedure: COLONOSCOPY WITH PROPOFOL;  Surgeon: Garlan Fair, MD;  Location: [facility];  Service: Endoscopy;  Laterality: N/A;  . ELBOW BURSA SURGERY Right   . SHOULDER ARTHROSCOPY Bilateral 1980   rotator cuff repair    Current Outpatient Rx  . Order #: [id number] Class: Historical Med  . Order #: [id number] Class: Historical Med  . Order #: [id number] Class: Historical Med  . Order #: [id number] Class: Print  . Order #: [id number] Class: Print  . Order #: [id number] Class: Historical Med  . Order #: [id number] Class: Print  . Order #: [id number] Class: Historical Med    Allergies Patient has no  known allergies.  Family History  Problem Relation Age of Onset  . Anesthesia problems Neg Hx     Social History Social History  Substance Use Topics  . Smoking status: Never Smoker  . Smokeless tobacco: Never Used  . Alcohol use 4.2 oz/week    7 Glasses of wine per week     Comment: wine 1/2 bottle  5 days per week    Review of Systems  Constitutional: No fever/chills Eyes: No visual changes. ENT: No sore throat. Cardiovascular: Denies chest pain. Respiratory: Denies shortness of breath. Gastrointestinal: No abdominal pain.  No nausea, no vomiting.  No diarrhea.  No constipation. Genitourinary: Negative for dysuria. Positive intermittent right flank pain.  Musculoskeletal: Negative for back pain. Skin: Negative for rash. Neurological: Negative for headaches, focal weakness or numbness.  10-point ROS otherwise negative.  ____________________________________________   PHYSICAL EXAM:  VITAL SIGNS: ED Triage Vitals  Enc Vitals Group     BP 01/20/17 1008 140/95     Pulse Rate 01/20/17 1008 108     Resp 01/20/17 1008 18     Temp 01/20/17 1008 97.6 F (36.4 C)     Temp Source 01/20/17 1008 Oral     SpO2 01/20/17 1008 98 %     Pain Score 01/20/17 1010 10   Constitutional: Alert and oriented. Well appearing and in no acute distress. Eyes: Conjunctivae are normal.  Head: Atraumatic. Nose: No congestion/rhinnorhea. Mouth/Throat: Mucous membranes are  moist.   Neck: No stridor.   Cardiovascular: Normal rate, regular rhythm. Good peripheral circulation. Grossly normal heart sounds.   Respiratory: Normal respiratory effort.  No retractions. Lungs CTAB. Gastrointestinal: Soft and nontender. No distention. Mild right CVA tenderness.  Musculoskeletal: No lower extremity tenderness nor edema. No gross deformities of extremities. Neurologic:  Normal speech and language. No gross focal neurologic deficits are appreciated.  Skin:  Skin is warm, dry and intact. No rash  noted. Psychiatric: Mood and affect are normal. Speech and behavior are normal.  ____________________________________________   LABS (all labs ordered are listed, but only abnormal results are displayed)  Labs Reviewed  COMPREHENSIVE METABOLIC PANEL - Abnormal; Notable for the following:       Result Value   Glucose, Bld 116 (*)    Creatinine, Ser 1.63 (*)    Total Bilirubin 1.4 (*)    GFR calc non Af Amer 42 (*)    GFR calc Af Amer 49 (*)    All other components within normal limits  URINALYSIS, ROUTINE W REFLEX MICROSCOPIC - Abnormal; Notable for the following:    Hgb urine dipstick MODERATE (*)    Bacteria, UA RARE (*)    Squamous Epithelial / LPF 0-5 (*)    All other components within normal limits  LIPASE, BLOOD  CBC   ____________________________________________  RADIOLOGY  Ct Renal Stone Study  Result Date: 01/20/2017 CLINICAL DATA:  Right flank pain, right lower quadrant pain EXAM: CT ABDOMEN AND PELVIS WITHOUT CONTRAST TECHNIQUE: Multidetector CT imaging of the abdomen and pelvis was performed following the standard protocol without IV contrast. COMPARISON:  None. FINDINGS: Lower chest: Lung bases are clear. No effusions. Heart is normal size. Hepatobiliary: No focal hepatic abnormality. Gallbladder unremarkable. Pancreas: No focal abnormality or ductal dilatation. Spleen: No focal abnormality.  Normal size. Adrenals/Urinary Tract: Punctate nonobstructing stones in the upper and lower poles of the right kidney. 4 mm stone noted at the pelvic brim within the mid right ureter. No hydronephrosis. Adrenal glands and urinary bladder unremarkable. Stomach/Bowel: Sigmoid diverticulosis. No active diverticulitis. Appendix is normal. Stomach and small bowel decompressed, unremarkable. Vascular/Lymphatic: Aortic and iliac calcifications. No aneurysm or adenopathy. Reproductive: No visible focal abnormality. Other: No free fluid or free air. Small bilateral inguinal hernias containing  fat. Musculoskeletal: No acute bony abnormality. IMPRESSION: 4 mm mid right ureteral stone at the pelvic brim no evidence of hydronephrosis. Punctate right nephrolithiasis. Sigmoid diverticulosis. Electronically Signed   By: Rolm Baptise M.D.   On: 01/20/2017 11:14    ____________________________________________   PROCEDURES  Procedure(s) performed:   Procedures  None ____________________________________________   INITIAL IMPRESSION / ASSESSMENT AND PLAN / ED COURSE  Pertinent labs & imaging results that were available during my care of the patient were reviewed by me and considered in my medical decision making (see chart for details).  Patient resents to emergency department for evaluation of right flank pain that began suddenly during racquetball. He's had several weeks of symptoms but they became acutely worse today. Clinical suspicion is elevated for kidney stone. Lower suspicion for vascular etiology given the pain being on the right side is post the left. Abdomen is soft nontender. Does have some mild CVA tenderness. Plan for labs, urinalysis, renal CT and reassessment.  12:03 PM  Patient with a 4 mm nonobstructing stone in the right ureter. Otherwise CT scan is unremarkable. Patient's pain is well-controlled. He does have a slight increase in his creatinine with normal BUN. Otherwise labs are normal. Plan for  pain control and Flomax. He has had a urologist and we will call tomorrow and schedule a follow-up appointment given his history of cough located stones. Discussed return precautions for development of infection symptoms or intractable pain. Patient is comfortable with the plan at discharge.  At this time, I do not feel there is any life-threatening condition present. I have reviewed and discussed all results (EKG, imaging, lab, urine as appropriate), exam findings with patient. I have reviewed nursing notes and appropriate previous records.  I feel the patient is safe to be  discharged home without further emergent workup. Discussed usual and customary return precautions. Patient and family (if present) verbalize understanding and are comfortable with this plan.  Patient will follow-up with their primary care provider. If they do not have a primary care provider, information for follow-up has been provided to them. All questions have been answered.  ____________________________________________  FINAL CLINICAL IMPRESSION(S) / ED DIAGNOSES  Final diagnoses:  Right flank pain  Kidney stone     MEDICATIONS GIVEN DURING THIS VISIT:  Medications  ketorolac (TORADOL) 30 MG/ML injection 30 mg (30 mg Intravenous Given 01/20/17 1055)     NEW OUTPATIENT MEDICATIONS STARTED DURING THIS VISIT:  Discharge Medication List as of 01/20/2017 12:07 PM    START taking these medications   Details  ondansetron (ZOFRAN) 4 MG tablet Take 1 tablet (4 mg total) by mouth every 6 (six) hours., Starting Sun 01/20/2017, Print    HYDROcodone-acetaminophen (NORCO/VICODIN) 5-325 MG tablet Take 2 tablets by mouth every 4 (four) hours as needed., Starting Sun 01/20/2017, Print    tamsulosin (FLOMAX) 0.4 MG CAPS capsule Take 1 capsule (0.4 mg total) by mouth daily., Starting Sun 01/20/2017, Print          Note:  This document was prepared using Dragon voice recognition software and may include unintentional dictation errors.  Nanda Quinton, MD Emergency Medicine   Margette Fast, MD 01/20/17 [phone]

## 2017-01-20 NOTE — ED Notes (Signed)
Patient transported to CT 

## 2017-01-20 NOTE — ED Triage Notes (Signed)
Pt c/o right flank pain radiating to RLQ x several days, pain worsened and moved caudally today after playing sports. No emesis, diarrhea. No urinary symptoms.

## 2017-01-20 NOTE — Discharge Instructions (Signed)
You have been seen in the Emergency Department (ED) today for pain that we believe based on your workup, is caused by kidney stones.  As we have discussed, please drink plenty of fluids.  Please make a follow up appointment with the physician(s) listed elsewhere in this documentation. ° °You may take pain medication as needed but ONLY as prescribed.  Please also take your prescribed Flomax daily.  We also recommend that you take over-the-counter ibuprofen regularly according to label instructions over the next 5 days.  Take it with meals to minimize stomach discomfort. ° °Please see your doctor as soon as possible as stones may take 1-3 weeks to pass and you may require additional care or medications. ° °Do not drink alcohol, drive or participate in any other potentially dangerous activities while taking opiate pain medication as it may make you sleepy. Do not take this medication with any other sedating medications, either prescription or over-the-counter. If you were prescribed Percocet or Vicodin, do not take these with acetaminophen (Tylenol) as it is already contained within these medications. °  °Take Vicodin as needed for severe pain.  This medication is an opiate (or narcotic) pain medication and can be habit forming.  Use it as little as possible to achieve adequate pain control.  Do not use or use it with extreme caution if you have a history of opiate abuse or dependence.  If you are on a pain contract with your primary care doctor or a pain specialist, be sure to let them know you were prescribed this medication today from the Emergency Department.  This medication is intended for your use only - do not give any to anyone else and keep it in a secure place where nobody else, especially children, have access to it.  It will also cause or worsen constipation, so you may want to consider taking an over-the-counter stool softener while you are taking this medication. ° °Return to the Emergency Department  (ED) or call your doctor if you have any worsening pain, fever, painful urination, are unable to urinate, or develop other symptoms that concern you. ° ° ° °Kidney Stones °Kidney stones (urolithiasis) are deposits that form inside your kidneys. The intense pain is caused by the stone moving through the urinary tract. When the stone moves, the ureter goes into spasm around the stone. The stone is usually passed in the urine.  °CAUSES  °A disorder that makes certain neck glands produce too much parathyroid hormone (primary hyperparathyroidism). °A buildup of uric acid crystals, similar to gout in your joints. °Narrowing (stricture) of the ureter. °A kidney obstruction present at birth (congenital obstruction). °Previous surgery on the kidney or ureters. °Numerous kidney infections. °SYMPTOMS  °Feeling sick to your stomach (nauseous). °Throwing up (vomiting). °Blood in the urine (hematuria). °Pain that usually spreads (radiates) to the groin. °Frequency or urgency of urination. °DIAGNOSIS  °Taking a history and physical exam. °Blood or urine tests. °CT scan. °Occasionally, an examination of the inside of the urinary bladder (cystoscopy) is performed. °TREATMENT  °Observation. °Increasing your fluid intake. °Extracorporeal shock wave lithotripsy--This is a noninvasive procedure that uses shock waves to break up kidney stones. °Surgery may be needed if you have severe pain or persistent obstruction. There are various surgical procedures. Most of the procedures are performed with the use of small instruments. Only small incisions are needed to accommodate these instruments, so recovery time is minimized. °The size, location, and chemical composition are all important variables that will determine the proper   choice of action for you. Talk to your health care provider to better understand your situation so that you will minimize the risk of injury to yourself and your kidney.  °HOME CARE INSTRUCTIONS  °Drink enough water  and fluids to keep your urine clear or pale yellow. This will help you to pass the stone or stone fragments. °Strain all urine through the provided strainer. Keep all particulate matter and stones for your health care provider to see. The stone causing the pain may be as small as a grain of salt. It is very important to use the strainer each and every time you pass your urine. The collection of your stone will allow your health care provider to analyze it and verify that a stone has actually passed. The stone analysis will often identify what you can do to reduce the incidence of recurrences. °Only take over-the-counter or prescription medicines for pain, discomfort, or fever as directed by your health care provider. °Keep all follow-up visits as told by your health care provider. This is important. °Get follow-up X-rays if required. The absence of pain does not always mean that the stone has passed. It may have only stopped moving. If the urine remains completely obstructed, it can cause loss of kidney function or even complete destruction of the kidney. It is your responsibility to make sure X-rays and follow-ups are completed. Ultrasounds of the kidney can show blockages and the status of the kidney. Ultrasounds are not associated with any radiation and can be performed easily in a matter of minutes. °Make changes to your daily diet as told by your health care provider. You may be told to: °Limit the amount of salt that you eat. °Eat 5 or more servings of fruits and vegetables each day. °Limit the amount of meat, poultry, fish, and eggs that you eat. °Collect a 24-hour urine sample as told by your health care provider. You may need to collect another urine sample every 6-12 months. °SEEK MEDICAL CARE IF: °You experience pain that is progressive and unresponsive to any pain medicine you have been prescribed. °SEEK IMMEDIATE MEDICAL CARE IF:  °Pain cannot be controlled with the prescribed medicine. °You have a  fever or shaking chills. °The severity or intensity of pain increases over 18 hours and is not relieved by pain medicine. °You develop a new onset of abdominal pain. °You feel faint or pass out. °You are unable to urinate. °  °This information is not intended to replace advice given to you by your health care provider. Make sure you discuss any questions you have with your health care provider. °  °Document Released: 12/10/2005 Document Revised: 08/31/2015 Document Reviewed: 05/13/2013 °[employer] Interactive Patient Education ©2016 [employer] ° ° °

## 2017-01-20 NOTE — ED Notes (Signed)
Discharge instructions, follow up care, and rx x3 reviewed with patient. Patient verbalized understanding. 

## 2017-01-25 DIAGNOSIS — N201 Calculus of ureter: Secondary | ICD-10-CM | POA: Diagnosis not present

## 2017-02-05 DIAGNOSIS — F419 Anxiety disorder, unspecified: Secondary | ICD-10-CM | POA: Diagnosis not present

## 2017-02-05 DIAGNOSIS — F329 Major depressive disorder, single episode, unspecified: Secondary | ICD-10-CM | POA: Diagnosis not present

## 2017-02-18 DIAGNOSIS — L570 Actinic keratosis: Secondary | ICD-10-CM | POA: Diagnosis not present

## 2017-02-19 DIAGNOSIS — S29012A Strain of muscle and tendon of back wall of thorax, initial encounter: Secondary | ICD-10-CM | POA: Diagnosis not present

## 2017-02-19 DIAGNOSIS — M9903 Segmental and somatic dysfunction of lumbar region: Secondary | ICD-10-CM | POA: Diagnosis not present

## 2017-02-19 DIAGNOSIS — M9905 Segmental and somatic dysfunction of pelvic region: Secondary | ICD-10-CM | POA: Diagnosis not present

## 2017-02-19 DIAGNOSIS — S335XXA Sprain of ligaments of lumbar spine, initial encounter: Secondary | ICD-10-CM | POA: Diagnosis not present

## 2017-02-19 DIAGNOSIS — S338XXA Sprain of other parts of lumbar spine and pelvis, initial encounter: Secondary | ICD-10-CM | POA: Diagnosis not present

## 2017-02-19 DIAGNOSIS — M9904 Segmental and somatic dysfunction of sacral region: Secondary | ICD-10-CM | POA: Diagnosis not present

## 2017-02-22 DIAGNOSIS — M9904 Segmental and somatic dysfunction of sacral region: Secondary | ICD-10-CM | POA: Diagnosis not present

## 2017-02-22 DIAGNOSIS — S335XXA Sprain of ligaments of lumbar spine, initial encounter: Secondary | ICD-10-CM | POA: Diagnosis not present

## 2017-02-22 DIAGNOSIS — S29012A Strain of muscle and tendon of back wall of thorax, initial encounter: Secondary | ICD-10-CM | POA: Diagnosis not present

## 2017-02-22 DIAGNOSIS — M9903 Segmental and somatic dysfunction of lumbar region: Secondary | ICD-10-CM | POA: Diagnosis not present

## 2017-02-22 DIAGNOSIS — M9905 Segmental and somatic dysfunction of pelvic region: Secondary | ICD-10-CM | POA: Diagnosis not present

## 2017-02-22 DIAGNOSIS — S338XXA Sprain of other parts of lumbar spine and pelvis, initial encounter: Secondary | ICD-10-CM | POA: Diagnosis not present

## 2017-02-26 DIAGNOSIS — N201 Calculus of ureter: Secondary | ICD-10-CM | POA: Diagnosis not present

## 2017-03-05 DIAGNOSIS — F419 Anxiety disorder, unspecified: Secondary | ICD-10-CM | POA: Diagnosis not present

## 2017-04-22 DIAGNOSIS — M25561 Pain in right knee: Secondary | ICD-10-CM | POA: Diagnosis not present

## 2017-05-06 DIAGNOSIS — L570 Actinic keratosis: Secondary | ICD-10-CM | POA: Diagnosis not present

## 2017-05-06 DIAGNOSIS — L820 Inflamed seborrheic keratosis: Secondary | ICD-10-CM | POA: Diagnosis not present

## 2017-05-17 DIAGNOSIS — L814 Other melanin hyperpigmentation: Secondary | ICD-10-CM | POA: Diagnosis not present

## 2017-07-19 DIAGNOSIS — L218 Other seborrheic dermatitis: Secondary | ICD-10-CM | POA: Diagnosis not present

## 2017-07-19 DIAGNOSIS — L820 Inflamed seborrheic keratosis: Secondary | ICD-10-CM | POA: Diagnosis not present

## 2017-07-19 DIAGNOSIS — L570 Actinic keratosis: Secondary | ICD-10-CM | POA: Diagnosis not present

## 2017-07-19 DIAGNOSIS — L578 Other skin changes due to chronic exposure to nonionizing radiation: Secondary | ICD-10-CM | POA: Diagnosis not present

## 2017-07-23 DIAGNOSIS — R946 Abnormal results of thyroid function studies: Secondary | ICD-10-CM | POA: Diagnosis not present

## 2017-07-23 DIAGNOSIS — G4700 Insomnia, unspecified: Secondary | ICD-10-CM | POA: Diagnosis not present

## 2017-07-23 DIAGNOSIS — F324 Major depressive disorder, single episode, in partial remission: Secondary | ICD-10-CM | POA: Diagnosis not present

## 2017-07-23 DIAGNOSIS — Z125 Encounter for screening for malignant neoplasm of prostate: Secondary | ICD-10-CM | POA: Diagnosis not present

## 2017-07-23 DIAGNOSIS — I10 Essential (primary) hypertension: Secondary | ICD-10-CM | POA: Diagnosis not present

## 2017-07-23 DIAGNOSIS — E7800 Pure hypercholesterolemia, unspecified: Secondary | ICD-10-CM | POA: Diagnosis not present

## 2017-07-23 DIAGNOSIS — F419 Anxiety disorder, unspecified: Secondary | ICD-10-CM | POA: Diagnosis not present

## 2017-07-31 DIAGNOSIS — E039 Hypothyroidism, unspecified: Secondary | ICD-10-CM | POA: Diagnosis not present

## 2017-07-31 DIAGNOSIS — R946 Abnormal results of thyroid function studies: Secondary | ICD-10-CM | POA: Diagnosis not present

## 2017-09-13 DIAGNOSIS — E039 Hypothyroidism, unspecified: Secondary | ICD-10-CM | POA: Diagnosis not present

## 2017-09-15 ENCOUNTER — Encounter (HOSPITAL_COMMUNITY): Payer: Self-pay | Admitting: Emergency Medicine

## 2017-09-15 ENCOUNTER — Emergency Department (HOSPITAL_COMMUNITY)
Admission: EM | Admit: 2017-09-15 | Discharge: 2017-09-16 | Disposition: A | Discharge status: Home / Self Care | Payer: No Typology Code available for payment source | Attending: Emergency Medicine | Admitting: Emergency Medicine

## 2017-09-15 ENCOUNTER — Emergency Department (HOSPITAL_COMMUNITY): Payer: No Typology Code available for payment source

## 2017-09-15 DIAGNOSIS — Y939 Activity, unspecified: Secondary | ICD-10-CM | POA: Insufficient documentation

## 2017-09-15 DIAGNOSIS — Y999 Unspecified external cause status: Secondary | ICD-10-CM | POA: Insufficient documentation

## 2017-09-15 DIAGNOSIS — K5730 Diverticulosis of large intestine without perforation or abscess without bleeding: Secondary | ICD-10-CM | POA: Diagnosis not present

## 2017-09-15 DIAGNOSIS — I10 Essential (primary) hypertension: Secondary | ICD-10-CM | POA: Insufficient documentation

## 2017-09-15 DIAGNOSIS — Y92410 Unspecified street and highway as the place of occurrence of the external cause: Secondary | ICD-10-CM | POA: Insufficient documentation

## 2017-09-15 DIAGNOSIS — Z79899 Other long term (current) drug therapy: Secondary | ICD-10-CM | POA: Diagnosis not present

## 2017-09-15 DIAGNOSIS — S3991XA Unspecified injury of abdomen, initial encounter: Secondary | ICD-10-CM | POA: Diagnosis not present

## 2017-09-15 DIAGNOSIS — S60512A Abrasion of left hand, initial encounter: Secondary | ICD-10-CM | POA: Diagnosis not present

## 2017-09-15 DIAGNOSIS — Z041 Encounter for examination and observation following transport accident: Secondary | ICD-10-CM | POA: Insufficient documentation

## 2017-09-15 DIAGNOSIS — S0990XA Unspecified injury of head, initial encounter: Secondary | ICD-10-CM | POA: Diagnosis not present

## 2017-09-15 DIAGNOSIS — S299XXA Unspecified injury of thorax, initial encounter: Secondary | ICD-10-CM | POA: Diagnosis not present

## 2017-09-15 DIAGNOSIS — S199XXA Unspecified injury of neck, initial encounter: Secondary | ICD-10-CM | POA: Diagnosis not present

## 2017-09-15 LAB — COMPREHENSIVE METABOLIC PANEL
ALK PHOS: 66 U/L (ref 38–126)
ALT: 30 U/L (ref 17–63)
AST: 43 U/L — AB (ref 15–41)
Albumin: 4 g/dL (ref 3.5–5.0)
Anion gap: 10 (ref 5–15)
BUN: 20 mg/dL (ref 6–20)
CALCIUM: 9.2 mg/dL (ref 8.9–10.3)
CHLORIDE: 104 mmol/L (ref 101–111)
CO2: 23 mmol/L (ref 22–32)
CREATININE: 1.24 mg/dL (ref 0.61–1.24)
GFR calc Af Amer: >60 mL/min (ref >60)
GFR calc non Af Amer: 58 mL/min — ABNORMAL LOW (ref >60)
GLUCOSE: 94 mg/dL (ref 65–99)
Potassium: 4.1 mmol/L (ref 3.5–5.1)
SODIUM: 137 mmol/L (ref 135–145)
Total Bilirubin: 0.8 mg/dL (ref 0.3–1.2)
Total Protein: 6.8 g/dL (ref 6.5–8.1)

## 2017-09-15 LAB — ETHANOL: Alcohol, Ethyl (B): 228 mg/dL — ABNORMAL HIGH (ref <5)

## 2017-09-15 LAB — CBC WITH DIFFERENTIAL/PLATELET
BASOS ABS: 0 10*3/uL (ref 0.0–0.1)
Basophils Relative: 0 %
EOS ABS: 0.3 10*3/uL (ref 0.0–0.7)
Eosinophils Relative: 5 %
HEMATOCRIT: 38 % — AB (ref 39.0–52.0)
HEMOGLOBIN: 12.9 g/dL — AB (ref 13.0–17.0)
LYMPHS ABS: 2 10*3/uL (ref 0.7–4.0)
LYMPHS PCT: 36 %
MCH: 30.9 pg (ref 26.0–34.0)
MCHC: 33.9 g/dL (ref 30.0–36.0)
MCV: 90.9 fL (ref 78.0–100.0)
Monocytes Absolute: 0.4 10*3/uL (ref 0.1–1.0)
Monocytes Relative: 8 %
Neutro Abs: 2.8 10*3/uL (ref 1.7–7.7)
Neutrophils Relative %: 51 %
Platelets: 189 10*3/uL (ref 150–400)
RBC: 4.18 MIL/uL — ABNORMAL LOW (ref 4.22–5.81)
RDW: 13.9 % (ref 11.5–15.5)
WBC: 5.4 10*3/uL (ref 4.0–10.5)

## 2017-09-15 LAB — URINALYSIS, ROUTINE W REFLEX MICROSCOPIC
Bacteria, UA: NONE SEEN
Bilirubin Urine: NEGATIVE
GLUCOSE, UA: NEGATIVE mg/dL
KETONES UR: NEGATIVE mg/dL
LEUKOCYTES UA: NEGATIVE
Nitrite: NEGATIVE
PH: 6 (ref 5.0–8.0)
PROTEIN: NEGATIVE mg/dL
Specific Gravity, Urine: 1.003 — ABNORMAL LOW (ref 1.005–1.030)
Squamous Epithelial / LPF: NONE SEEN

## 2017-09-15 LAB — RAPID URINE DRUG SCREEN, HOSP PERFORMED
Amphetamines: NOT DETECTED
BARBITURATES: NOT DETECTED
Benzodiazepines: NOT DETECTED
Cocaine: NOT DETECTED
Opiates: NOT DETECTED
Tetrahydrocannabinol: NOT DETECTED

## 2017-09-15 LAB — I-STAT TROPONIN, ED: TROPONIN I, POC: 0.01 ng/mL (ref 0.00–0.08)

## 2017-09-15 LAB — TSH: TSH: 7.707 u[IU]/mL — ABNORMAL HIGH (ref 0.350–4.500)
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
## 2017-09-16 ENCOUNTER — Emergency Department (HOSPITAL_COMMUNITY): Payer: No Typology Code available for payment source

## 2017-09-16 DIAGNOSIS — S3991XA Unspecified injury of abdomen, initial encounter: Secondary | ICD-10-CM | POA: Diagnosis not present

## 2017-09-16 DIAGNOSIS — K5730 Diverticulosis of large intestine without perforation or abscess without bleeding: Secondary | ICD-10-CM | POA: Diagnosis not present

## 2017-09-16 MED ORDER — IOPAMIDOL (ISOVUE-300) INJECTION 61%
INTRAVENOUS | Status: AC
  Administered 2017-09-16: 100 mL
  Filled 2017-09-16: qty 100
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
## 2017-09-16 NOTE — ED Notes (Signed)
Pt rang call bell, stating, "I believe it's time for me to get out. I was told that I would be able to leave after midnight." Informed pt that he was not up for discharge at this moment, but that I would confer with his RN.

## 2017-09-16 NOTE — ED Notes (Signed)
Taken to ct at this time. 

## 2017-10-09 DIAGNOSIS — L570 Actinic keratosis: Secondary | ICD-10-CM | POA: Diagnosis not present

## 2017-10-21 DIAGNOSIS — E663 Overweight: Secondary | ICD-10-CM | POA: Diagnosis not present

## 2017-10-21 DIAGNOSIS — Z23 Encounter for immunization: Secondary | ICD-10-CM | POA: Diagnosis not present

## 2017-10-21 DIAGNOSIS — E039 Hypothyroidism, unspecified: Secondary | ICD-10-CM | POA: Diagnosis not present

## 2017-10-21 DIAGNOSIS — F419 Anxiety disorder, unspecified: Secondary | ICD-10-CM | POA: Diagnosis not present

## 2017-10-21 DIAGNOSIS — G4700 Insomnia, unspecified: Secondary | ICD-10-CM | POA: Diagnosis not present

## 2017-11-01 DIAGNOSIS — K5732 Diverticulitis of large intestine without perforation or abscess without bleeding: Secondary | ICD-10-CM | POA: Diagnosis not present

## 2017-11-26 DIAGNOSIS — L570 Actinic keratosis: Secondary | ICD-10-CM | POA: Diagnosis not present

## 2017-12-20 DIAGNOSIS — R21 Rash and other nonspecific skin eruption: Secondary | ICD-10-CM | POA: Diagnosis not present

## 2017-12-20 DIAGNOSIS — G4700 Insomnia, unspecified: Secondary | ICD-10-CM | POA: Diagnosis not present

## 2018-03-20 DIAGNOSIS — K5900 Constipation, unspecified: Secondary | ICD-10-CM | POA: Diagnosis not present

## 2018-03-20 DIAGNOSIS — F3289 Other specified depressive episodes: Secondary | ICD-10-CM | POA: Diagnosis not present

## 2018-03-20 DIAGNOSIS — R635 Abnormal weight gain: Secondary | ICD-10-CM | POA: Diagnosis not present

## 2018-03-20 DIAGNOSIS — Z79890 Hormone replacement therapy (postmenopausal): Secondary | ICD-10-CM | POA: Diagnosis not present

## 2018-03-20 DIAGNOSIS — R739 Hyperglycemia, unspecified: Secondary | ICD-10-CM | POA: Diagnosis not present

## 2018-03-20 DIAGNOSIS — R5382 Chronic fatigue, unspecified: Secondary | ICD-10-CM | POA: Diagnosis not present

## 2018-03-20 DIAGNOSIS — E039 Hypothyroidism, unspecified: Secondary | ICD-10-CM | POA: Diagnosis not present

## 2018-03-20 DIAGNOSIS — Z91048 Other nonmedicinal substance allergy status: Secondary | ICD-10-CM | POA: Diagnosis not present

## 2018-06-13 DIAGNOSIS — J209 Acute bronchitis, unspecified: Secondary | ICD-10-CM | POA: Diagnosis not present

## 2018-07-23 DIAGNOSIS — H2513 Age-related nuclear cataract, bilateral: Secondary | ICD-10-CM | POA: Diagnosis not present

## 2018-09-24 DIAGNOSIS — L570 Actinic keratosis: Secondary | ICD-10-CM | POA: Diagnosis not present

## 2018-11-26 DIAGNOSIS — J019 Acute sinusitis, unspecified: Secondary | ICD-10-CM | POA: Diagnosis not present

## 2018-12-01 DIAGNOSIS — S338XXA Sprain of other parts of lumbar spine and pelvis, initial encounter: Secondary | ICD-10-CM | POA: Diagnosis not present

## 2018-12-01 DIAGNOSIS — S29012A Strain of muscle and tendon of back wall of thorax, initial encounter: Secondary | ICD-10-CM | POA: Diagnosis not present

## 2018-12-01 DIAGNOSIS — S335XXA Sprain of ligaments of lumbar spine, initial encounter: Secondary | ICD-10-CM | POA: Diagnosis not present

## 2018-12-01 DIAGNOSIS — M9904 Segmental and somatic dysfunction of sacral region: Secondary | ICD-10-CM | POA: Diagnosis not present

## 2018-12-01 DIAGNOSIS — M9905 Segmental and somatic dysfunction of pelvic region: Secondary | ICD-10-CM | POA: Diagnosis not present

## 2018-12-01 DIAGNOSIS — M9903 Segmental and somatic dysfunction of lumbar region: Secondary | ICD-10-CM | POA: Diagnosis not present

## 2018-12-02 DIAGNOSIS — S29012A Strain of muscle and tendon of back wall of thorax, initial encounter: Secondary | ICD-10-CM | POA: Diagnosis not present

## 2018-12-02 DIAGNOSIS — M9905 Segmental and somatic dysfunction of pelvic region: Secondary | ICD-10-CM | POA: Diagnosis not present

## 2018-12-02 DIAGNOSIS — M9904 Segmental and somatic dysfunction of sacral region: Secondary | ICD-10-CM | POA: Diagnosis not present

## 2018-12-02 DIAGNOSIS — S338XXA Sprain of other parts of lumbar spine and pelvis, initial encounter: Secondary | ICD-10-CM | POA: Diagnosis not present

## 2018-12-02 DIAGNOSIS — M9903 Segmental and somatic dysfunction of lumbar region: Secondary | ICD-10-CM | POA: Diagnosis not present

## 2018-12-02 DIAGNOSIS — S335XXA Sprain of ligaments of lumbar spine, initial encounter: Secondary | ICD-10-CM | POA: Diagnosis not present

## 2018-12-04 DIAGNOSIS — M9904 Segmental and somatic dysfunction of sacral region: Secondary | ICD-10-CM | POA: Diagnosis not present

## 2018-12-04 DIAGNOSIS — M9903 Segmental and somatic dysfunction of lumbar region: Secondary | ICD-10-CM | POA: Diagnosis not present

## 2018-12-04 DIAGNOSIS — S29012A Strain of muscle and tendon of back wall of thorax, initial encounter: Secondary | ICD-10-CM | POA: Diagnosis not present

## 2018-12-04 DIAGNOSIS — S335XXA Sprain of ligaments of lumbar spine, initial encounter: Secondary | ICD-10-CM | POA: Diagnosis not present

## 2018-12-04 DIAGNOSIS — M9905 Segmental and somatic dysfunction of pelvic region: Secondary | ICD-10-CM | POA: Diagnosis not present

## 2018-12-04 DIAGNOSIS — S338XXA Sprain of other parts of lumbar spine and pelvis, initial encounter: Secondary | ICD-10-CM | POA: Diagnosis not present

## 2018-12-08 DIAGNOSIS — M9904 Segmental and somatic dysfunction of sacral region: Secondary | ICD-10-CM | POA: Diagnosis not present

## 2018-12-08 DIAGNOSIS — M9905 Segmental and somatic dysfunction of pelvic region: Secondary | ICD-10-CM | POA: Diagnosis not present

## 2018-12-08 DIAGNOSIS — S29012A Strain of muscle and tendon of back wall of thorax, initial encounter: Secondary | ICD-10-CM | POA: Diagnosis not present

## 2018-12-08 DIAGNOSIS — S335XXA Sprain of ligaments of lumbar spine, initial encounter: Secondary | ICD-10-CM | POA: Diagnosis not present

## 2018-12-08 DIAGNOSIS — S338XXA Sprain of other parts of lumbar spine and pelvis, initial encounter: Secondary | ICD-10-CM | POA: Diagnosis not present

## 2018-12-08 DIAGNOSIS — M9903 Segmental and somatic dysfunction of lumbar region: Secondary | ICD-10-CM | POA: Diagnosis not present

## 2018-12-10 DIAGNOSIS — M9905 Segmental and somatic dysfunction of pelvic region: Secondary | ICD-10-CM | POA: Diagnosis not present

## 2018-12-10 DIAGNOSIS — S335XXA Sprain of ligaments of lumbar spine, initial encounter: Secondary | ICD-10-CM | POA: Diagnosis not present

## 2018-12-10 DIAGNOSIS — M9903 Segmental and somatic dysfunction of lumbar region: Secondary | ICD-10-CM | POA: Diagnosis not present

## 2018-12-10 DIAGNOSIS — M9904 Segmental and somatic dysfunction of sacral region: Secondary | ICD-10-CM | POA: Diagnosis not present

## 2018-12-10 DIAGNOSIS — S338XXA Sprain of other parts of lumbar spine and pelvis, initial encounter: Secondary | ICD-10-CM | POA: Diagnosis not present

## 2018-12-10 DIAGNOSIS — S29012A Strain of muscle and tendon of back wall of thorax, initial encounter: Secondary | ICD-10-CM | POA: Diagnosis not present

## 2018-12-15 DIAGNOSIS — S338XXA Sprain of other parts of lumbar spine and pelvis, initial encounter: Secondary | ICD-10-CM | POA: Diagnosis not present

## 2018-12-15 DIAGNOSIS — S29012A Strain of muscle and tendon of back wall of thorax, initial encounter: Secondary | ICD-10-CM | POA: Diagnosis not present

## 2018-12-15 DIAGNOSIS — M9903 Segmental and somatic dysfunction of lumbar region: Secondary | ICD-10-CM | POA: Diagnosis not present

## 2018-12-15 DIAGNOSIS — M9905 Segmental and somatic dysfunction of pelvic region: Secondary | ICD-10-CM | POA: Diagnosis not present

## 2018-12-15 DIAGNOSIS — S335XXA Sprain of ligaments of lumbar spine, initial encounter: Secondary | ICD-10-CM | POA: Diagnosis not present

## 2018-12-15 DIAGNOSIS — M9904 Segmental and somatic dysfunction of sacral region: Secondary | ICD-10-CM | POA: Diagnosis not present

## 2019-01-12 DIAGNOSIS — T560X1A Toxic effect of lead and its compounds, accidental (unintentional), initial encounter: Secondary | ICD-10-CM | POA: Diagnosis not present

## 2019-03-18 DIAGNOSIS — D1801 Hemangioma of skin and subcutaneous tissue: Secondary | ICD-10-CM | POA: Diagnosis not present

## 2019-03-18 DIAGNOSIS — L821 Other seborrheic keratosis: Secondary | ICD-10-CM | POA: Diagnosis not present

## 2019-03-18 DIAGNOSIS — L819 Disorder of pigmentation, unspecified: Secondary | ICD-10-CM | POA: Diagnosis not present

## 2019-03-18 DIAGNOSIS — L814 Other melanin hyperpigmentation: Secondary | ICD-10-CM | POA: Diagnosis not present

## 2019-03-18 DIAGNOSIS — D229 Melanocytic nevi, unspecified: Secondary | ICD-10-CM | POA: Diagnosis not present

## 2019-03-18 DIAGNOSIS — L111 Transient acantholytic dermatosis [Grover]: Secondary | ICD-10-CM | POA: Diagnosis not present

## 2019-03-23 DIAGNOSIS — F4320 Adjustment disorder, unspecified: Secondary | ICD-10-CM | POA: Diagnosis not present

## 2019-03-23 DIAGNOSIS — G4700 Insomnia, unspecified: Secondary | ICD-10-CM | POA: Diagnosis not present

## 2019-03-23 DIAGNOSIS — I10 Essential (primary) hypertension: Secondary | ICD-10-CM | POA: Diagnosis not present

## 2019-03-23 DIAGNOSIS — E039 Hypothyroidism, unspecified: Secondary | ICD-10-CM | POA: Diagnosis not present

## 2019-08-05 DIAGNOSIS — C44629 Squamous cell carcinoma of skin of left upper limb, including shoulder: Secondary | ICD-10-CM | POA: Diagnosis not present

## 2019-08-05 DIAGNOSIS — D485 Neoplasm of uncertain behavior of skin: Secondary | ICD-10-CM | POA: Diagnosis not present

## 2019-09-23 DIAGNOSIS — E7800 Pure hypercholesterolemia, unspecified: Secondary | ICD-10-CM | POA: Diagnosis not present

## 2019-09-23 DIAGNOSIS — F329 Major depressive disorder, single episode, unspecified: Secondary | ICD-10-CM | POA: Diagnosis not present

## 2019-09-23 DIAGNOSIS — F324 Major depressive disorder, single episode, in partial remission: Secondary | ICD-10-CM | POA: Diagnosis not present

## 2019-09-23 DIAGNOSIS — I10 Essential (primary) hypertension: Secondary | ICD-10-CM | POA: Diagnosis not present

## 2019-09-23 DIAGNOSIS — E039 Hypothyroidism, unspecified: Secondary | ICD-10-CM | POA: Diagnosis not present

## 2019-09-28 DIAGNOSIS — F324 Major depressive disorder, single episode, in partial remission: Secondary | ICD-10-CM | POA: Diagnosis not present

## 2019-09-28 DIAGNOSIS — E039 Hypothyroidism, unspecified: Secondary | ICD-10-CM | POA: Diagnosis not present

## 2019-09-28 DIAGNOSIS — F329 Major depressive disorder, single episode, unspecified: Secondary | ICD-10-CM | POA: Diagnosis not present

## 2019-09-28 DIAGNOSIS — I10 Essential (primary) hypertension: Secondary | ICD-10-CM | POA: Diagnosis not present

## 2019-09-28 DIAGNOSIS — E7800 Pure hypercholesterolemia, unspecified: Secondary | ICD-10-CM | POA: Diagnosis not present

## 2019-10-22 ENCOUNTER — Other Ambulatory Visit: Payer: Self-pay

## 2019-10-22 ENCOUNTER — Encounter (HOSPITAL_COMMUNITY): Payer: Self-pay | Admitting: Emergency Medicine

## 2019-10-22 ENCOUNTER — Emergency Department (HOSPITAL_COMMUNITY)
Admission: EM | Admit: 2019-10-22 | Discharge: 2019-10-22 | Disposition: A | Discharge status: Home / Self Care | Payer: Medicare Other | Attending: Emergency Medicine | Admitting: Emergency Medicine

## 2019-10-22 ENCOUNTER — Emergency Department (HOSPITAL_COMMUNITY): Payer: Medicare Other

## 2019-10-22 DIAGNOSIS — Z5321 Procedure and treatment not carried out due to patient leaving prior to being seen by health care provider: Secondary | ICD-10-CM | POA: Diagnosis not present

## 2019-10-22 DIAGNOSIS — R0602 Shortness of breath: Secondary | ICD-10-CM | POA: Insufficient documentation

## 2019-10-22 DIAGNOSIS — M2550 Pain in unspecified joint: Secondary | ICD-10-CM | POA: Insufficient documentation

## 2019-10-22 DIAGNOSIS — R0789 Other chest pain: Secondary | ICD-10-CM | POA: Diagnosis not present

## 2019-10-22 DIAGNOSIS — R519 Headache, unspecified: Secondary | ICD-10-CM | POA: Diagnosis not present

## 2019-10-22 LAB — BASIC METABOLIC PANEL
Anion gap: 8 (ref 5–15)
BUN: 23 mg/dL (ref 8–23)
CO2: 24 mmol/L (ref 22–32)
Calcium: 9.3 mg/dL (ref 8.9–10.3)
Chloride: 108 mmol/L (ref 98–111)
Creatinine, Ser: 1.24 mg/dL (ref 0.61–1.24)
GFR calc Af Amer: >60 mL/min (ref >60)
GFR calc non Af Amer: 59 mL/min — ABNORMAL LOW (ref >60)
Glucose, Bld: 109 mg/dL — ABNORMAL HIGH (ref 70–99)
Potassium: 3.9 mmol/L (ref 3.5–5.1)
Sodium: 140 mmol/L (ref 135–145)

## 2019-10-22 LAB — CBC
HCT: 39.4 % (ref 39.0–52.0)
Hemoglobin: 13.4 g/dL (ref 13.0–17.0)
MCH: 32.4 pg (ref 26.0–34.0)
MCHC: 34 g/dL (ref 30.0–36.0)
MCV: 95.4 fL (ref 80.0–100.0)
Platelets: 223 10*3/uL (ref 150–400)
RBC: 4.13 MIL/uL — ABNORMAL LOW (ref 4.22–5.81)
RDW: 12.6 % (ref 11.5–15.5)
WBC: 6.2 10*3/uL (ref 4.0–10.5)
nRBC: 0 % (ref 0.0–0.2)

## 2019-10-22 LAB — TROPONIN I (HIGH SENSITIVITY): Troponin I (High Sensitivity): 4 ng/L (ref <18)

## 2019-10-22 NOTE — ED Triage Notes (Signed)
Patient reports SOB, chest pressure, headache, and joint pain x2 weeks. Denies cough and fever.

## 2019-11-13 ENCOUNTER — Ambulatory Visit (INDEPENDENT_AMBULATORY_CARE_PROVIDER_SITE_OTHER): Payer: Medicare Other | Admitting: Cardiology

## 2019-11-13 ENCOUNTER — Encounter: Payer: Self-pay | Admitting: Cardiology

## 2019-11-13 ENCOUNTER — Other Ambulatory Visit: Payer: Self-pay

## 2019-11-13 VITALS — BP 142/91 | HR 90 | Ht 68.0 in | Wt 216.0 lb

## 2019-11-13 DIAGNOSIS — R5383 Other fatigue: Secondary | ICD-10-CM | POA: Diagnosis not present

## 2019-11-13 DIAGNOSIS — R9431 Abnormal electrocardiogram [ECG] [EKG]: Secondary | ICD-10-CM

## 2019-11-13 DIAGNOSIS — R0789 Other chest pain: Secondary | ICD-10-CM | POA: Diagnosis not present

## 2019-11-13 DIAGNOSIS — E7800 Pure hypercholesterolemia, unspecified: Secondary | ICD-10-CM

## 2019-11-13 DIAGNOSIS — I10 Essential (primary) hypertension: Secondary | ICD-10-CM

## 2019-11-13 DIAGNOSIS — R5381 Other malaise: Secondary | ICD-10-CM | POA: Diagnosis not present

## 2019-11-13 MED ORDER — AMLODIPINE BESYLATE 5 MG PO TABS: 5.0000 mg | ORAL_TABLET | Freq: Every day | ORAL | 2 refills | Status: DC

## 2019-11-13 NOTE — Progress Notes (Signed)
Primary Physician/Referring:  [name], MD  Patient ID: Michael Melendez, male    DOB: Feb 17, 1951, 68 y.o.   MRN: [id number]  Chief Complaint  Patient presents with  . Chest Pain    abnormal EKG  . New Patient (Initial Visit)   HPI:    JONTHAN Melendez  is a 68 y.o. Caucasian male with hypertension, history of kidney stones, hyperlipidemia, prior tobacco use disorder, gout who was seen in the emergency room on October 22, 2019 for atypical chest pain, chest x-ray, high sensitive serum troponins were negative and was discharged home with outpatient follow-up recommendation.    He had mildly abnormal EKG with nonspecific T abnormality.  Also had elevated blood pressure, peak blood pressure was recorded at 190/95 mmHg.  Patient states that over the past few months, he has had frequent episodes of chest pain especially when he is stressed and occasionally when he exerts.  Usually lasts 5-10 minutes and spontaneously resolves.  No other associated symptoms.  He is also noticed that over the past few months he gets unusually fatigued and tired more than usual, usually he is very active and exercises fairly frequently.    Past Medical History:  Diagnosis Date  . Carpal tunnel syndrome    on right pt says surgery 12-24-11 never occurred  . Depression   . Gout   . History of kidney stones    none in years  . Hypertension    Past Surgical History:  Procedure Laterality Date  . ANKLE RECONSTRUCTION Bilateral 1996  . COLONOSCOPY WITH PROPOFOL N/A 07/27/2014   Procedure: COLONOSCOPY WITH PROPOFOL;  Surgeon: Garlan Fair, MD;  Location: [facility];  Service: Endoscopy;  Laterality: N/A;  . ELBOW BURSA SURGERY Right   . SHOULDER ARTHROSCOPY Bilateral 1980   rotator cuff repair   Social History   Socioeconomic History  . Marital status: Widowed    Spouse name: Not on file  . Number of children: 2  . Years of education: Not on file  . Highest education level: Not on file  Occupational  History  . Not on file  Social Needs  . Financial resource strain: Not on file  . Food insecurity    Worry: Not on file    Inability: Not on file  . Transportation needs    Medical: Not on file    Non-medical: Not on file  Tobacco Use  . Smoking status: Never Smoker  . Smokeless tobacco: Never Used  Substance and Sexual Activity  . Alcohol use: Yes    Alcohol/week: 7.0 standard drinks    Types: 7 Glasses of wine per week    Comment: 1 bootle of wine per week  . Drug use: No  . Sexual activity: Not on file  Lifestyle  . Physical activity    Days per week: Not on file    Minutes per session: Not on file  . Stress: Not on file  Relationships  . Social Herbalist on phone: Not on file    Gets together: Not on file    Attends religious service: Not on file    Active member of club or organization: Not on file    Attends meetings of clubs or organizations: Not on file    Relationship status: Not on file  . Intimate partner violence    Fear of current or ex partner: Not on file    Emotionally abused: Not on file    Physically abused: Not  on file    Forced sexual activity: Not on file  Other Topics Concern  . Not on file  Social History Narrative  . Not on file   ROS  Review of Systems  Constitution: Positive for malaise/fatigue. Negative for chills, decreased appetite and weight gain.  Cardiovascular: Positive for chest pain and dyspnea on exertion. Negative for leg swelling and syncope.  Respiratory: Positive for snoring (mild and only when he sleeps on back).   Endocrine: Negative for cold intolerance.  Hematologic/Lymphatic: Does not bruise/bleed easily.  Musculoskeletal: Negative for joint swelling.  Gastrointestinal: Negative for abdominal pain, anorexia, change in bowel habit, hematochezia and melena.  Neurological: Negative for headaches and light-headedness.  Psychiatric/Behavioral: Negative for depression and substance abuse.  All other systems  reviewed and are negative.  Objective   Vitals with BMI 11/13/2019 10/22/2019 10/22/2019  Height 5\' 8"  - 5\' 8"   Weight 216 lbs - 215 lbs  BMI [id number] - [id number]  Systolic [id number]  Diastolic 91 98 95  Pulse 90 64 69     Physical Exam Laboratory examination:   Recent Labs    10/22/19 1840  NA 140  K 3.9  CL 108  CO2 24  GLUCOSE 109*  BUN 23  CREATININE 1.24  CALCIUM 9.3  GFRNONAA 59*  GFRAA >60   CrCl cannot be calculated (Patient's most recent lab result is older than the maximum 21 days allowed.).  CMP Latest Ref Rng & Units 10/22/2019 09/15/2017 01/20/2017  Glucose 70 - 99 mg/dL 109(H) 94 116(H)  BUN 8 - 23 mg/dL 23 20 20   Creatinine 0.61 - 1.24 mg/dL 1.24 1.24 1.63(H)  Sodium 135 - 145 mmol/L 140 137 140  Potassium 3.5 - 5.1 mmol/L 3.9 4.1 3.9  Chloride 98 - 111 mmol/L 108 104 107  CO2 22 - 32 mmol/L 24 23 25   Calcium 8.9 - 10.3 mg/dL 9.3 9.2 9.6  Total Protein 6.5 - 8.1 g/dL - 6.8 7.3  Total Bilirubin 0.3 - 1.2 mg/dL - 0.8 1.4(H)  Alkaline Phos 38 - 126 U/L - 66 70  AST 15 - 41 U/L - 43(H) 29  ALT 17 - 63 U/L - 30 25   CBC Latest Ref Rng & Units 10/22/2019 09/15/2017 01/20/2017  WBC 4.0 - 10.5 K/uL 6.2 5.4 4.4  Hemoglobin 13.0 - 17.0 g/dL 13.4 12.9(L) 14.0  Hematocrit 39.0 - 52.0 % 39.4 38.0(L) 39.7  Platelets 150 - 400 K/uL 223 189 211   Lipid Panel  No results found for: CHOL, TRIG, HDL, CHOLHDL, VLDL, LDLCALC, LDLDIRECT HEMOGLOBIN A1C No results found for: HGBA1C, MPG TSH No results for input(s): TSH in the last 8760 hours. Medications and allergies   Allergies  Allergen Reactions  . Other Other (See Comments)    Stuffy nose & eye watering.  PET DANDER     Current Outpatient Medications  Medication Instructions  . allopurinol (ZYLOPRIM) 300 mg, Oral, Daily  . ALPRAZolam (XANAX) 0.25 mg, Oral, At bedtime PRN  . amLODipine (NORVASC) 5 mg, Oral, Daily  . escitalopram (LEXAPRO) 10 mg, Oral, Daily at bedtime  . Eszopiclone 3 mg, Oral, Daily at  bedtime  . ibuprofen (ADVIL) 400 mg, Oral, Every 6 hours PRN  . levothyroxine (SYNTHROID) 100 mcg, Oral, Daily before breakfast  . losartan (COZAAR) 50 mg, Oral, Daily  . Multiple Vitamin (MULTIVITAMIN WITH MINERALS) TABS tablet 1 tablet, Oral, Daily    Radiology:  No results found. Cardiac Studies:   None  Assessment  ICD-10-CM   1. Atypical chest pain  R07.89 EKG 12-Lead    PCV MYOCARDIAL PERFUSION WO LEXISCAN    PCV ECHOCARDIOGRAM COMPLETE  2. Nonspecific abnormal electrocardiogram (ECG) (EKG)  R94.31 PCV MYOCARDIAL PERFUSION WO LEXISCAN  3. Primary hypertension  I10 PCV MYOCARDIAL PERFUSION WO LEXISCAN    PCV ECHOCARDIOGRAM COMPLETE  4. Hypercholesteremia  E78.00   5. Malaise and fatigue  R53.81    R53.83     EKG November 13, 2019: Normal sinus rhythm with rate of 80 bpm, borderline left atrial abnormality, poor R wave progression, cannot exclude anteroseptal infarct old.  No evidence of ischemia.  Compared to EKG October 22, 2019, ST-T wave abnormality, cannot exclude inferior and lateral ischemia.  Normal QT interval are present.    Recommendations:   Meds ordered this encounter  Medications  . amLODipine (NORVASC) 5 MG tablet    Sig: Take 1 tablet (5 mg total) by mouth daily.    Dispense:  30 tablet    Refill:  2    TOREE DILLAHUNT  is a 68 y.o.  Caucasian male with hypertension, history of kidney stones, hyperlipidemia, prior tobacco use disorder, gout who was seen in the emergency room on October 22, 2019 for atypical chest pain, chest x-ray, high sensitive serum troponins were negative and was discharged home with outpatient follow-up recommendation.   Chest pain symptoms are worrisome, he has untreated hypertension and abnormal EKG and during episode of chest pain had ST depressions that is now resolved and was seen in the emergency room.  He also has noticed marked fatigue and decreased exercise tolerance which will be anginal equivalent. I have added amlodipine  both for hypertension and also for possible angina pectoris.  He is been scheduled for complete physical including labs, hence I'll wait for starting him on statins depending upon his latest lipids, will also request LPA and direct LDL.  Schedule for a Exercise Nuclear stress test to evaluate for myocardial ischemia. Will schedule for an echocardiogram. Office visit following the work-up/investigations.  I have reviewed the labs and EKG from the ED.   Adrian Prows, MD, [facility] 11/14/2019, 8:58 AM [facility] [place] Pager: [phone] Office: [phone] If no answer Cell [phone]

## 2019-11-16 DIAGNOSIS — Z23 Encounter for immunization: Secondary | ICD-10-CM | POA: Diagnosis not present

## 2019-11-16 DIAGNOSIS — Z1389 Encounter for screening for other disorder: Secondary | ICD-10-CM | POA: Diagnosis not present

## 2019-11-16 DIAGNOSIS — F419 Anxiety disorder, unspecified: Secondary | ICD-10-CM | POA: Diagnosis not present

## 2019-11-16 DIAGNOSIS — Z0000 Encounter for general adult medical examination without abnormal findings: Secondary | ICD-10-CM | POA: Diagnosis not present

## 2019-11-16 DIAGNOSIS — Z125 Encounter for screening for malignant neoplasm of prostate: Secondary | ICD-10-CM | POA: Diagnosis not present

## 2019-11-16 DIAGNOSIS — E039 Hypothyroidism, unspecified: Secondary | ICD-10-CM | POA: Diagnosis not present

## 2019-11-16 DIAGNOSIS — G4700 Insomnia, unspecified: Secondary | ICD-10-CM | POA: Diagnosis not present

## 2019-11-16 DIAGNOSIS — E7800 Pure hypercholesterolemia, unspecified: Secondary | ICD-10-CM | POA: Diagnosis not present

## 2019-11-16 DIAGNOSIS — I10 Essential (primary) hypertension: Secondary | ICD-10-CM | POA: Diagnosis not present

## 2019-11-18 ENCOUNTER — Ambulatory Visit (INDEPENDENT_AMBULATORY_CARE_PROVIDER_SITE_OTHER): Payer: Medicare Other

## 2019-11-18 ENCOUNTER — Other Ambulatory Visit: Payer: Self-pay

## 2019-11-18 DIAGNOSIS — R0789 Other chest pain: Secondary | ICD-10-CM

## 2019-11-18 DIAGNOSIS — I10 Essential (primary) hypertension: Secondary | ICD-10-CM | POA: Diagnosis not present

## 2019-11-25 ENCOUNTER — Other Ambulatory Visit: Payer: Self-pay

## 2019-11-25 ENCOUNTER — Ambulatory Visit (INDEPENDENT_AMBULATORY_CARE_PROVIDER_SITE_OTHER): Payer: Medicare Other

## 2019-11-25 DIAGNOSIS — I10 Essential (primary) hypertension: Secondary | ICD-10-CM

## 2019-11-25 DIAGNOSIS — R9431 Abnormal electrocardiogram [ECG] [EKG]: Secondary | ICD-10-CM

## 2019-11-25 DIAGNOSIS — R0789 Other chest pain: Secondary | ICD-10-CM | POA: Diagnosis not present

## 2019-12-31 ENCOUNTER — Ambulatory Visit (INDEPENDENT_AMBULATORY_CARE_PROVIDER_SITE_OTHER): Payer: Medicare Other | Admitting: Cardiology

## 2019-12-31 ENCOUNTER — Other Ambulatory Visit: Payer: Self-pay

## 2019-12-31 ENCOUNTER — Encounter: Payer: Self-pay | Admitting: Cardiology

## 2019-12-31 VITALS — BP 140/87 | HR 74 | Temp 98.0°F | Ht 68.0 in | Wt 225.0 lb

## 2019-12-31 DIAGNOSIS — I209 Angina pectoris, unspecified: Secondary | ICD-10-CM | POA: Diagnosis not present

## 2019-12-31 DIAGNOSIS — I10 Essential (primary) hypertension: Secondary | ICD-10-CM

## 2019-12-31 DIAGNOSIS — I129 Hypertensive chronic kidney disease with stage 1 through stage 4 chronic kidney disease, or unspecified chronic kidney disease: Secondary | ICD-10-CM

## 2019-12-31 DIAGNOSIS — N522 Drug-induced erectile dysfunction: Secondary | ICD-10-CM

## 2019-12-31 DIAGNOSIS — E7800 Pure hypercholesterolemia, unspecified: Secondary | ICD-10-CM | POA: Diagnosis not present

## 2019-12-31 DIAGNOSIS — N1831 Chronic kidney disease, stage 3a: Secondary | ICD-10-CM

## 2019-12-31 MED ORDER — AMLODIPINE BESYLATE 10 MG PO TABS: 10.0000 mg | ORAL_TABLET | Freq: Every day | ORAL | 3 refills | Status: DC

## 2019-12-31 MED ORDER — SILDENAFIL CITRATE 100 MG PO TABS: 100.0000 mg | ORAL_TABLET | Freq: Every day | ORAL | 1 refills | Status: DC | PRN

## 2019-12-31 MED ORDER — ATORVASTATIN CALCIUM 10 MG PO TABS: 10.0000 mg | ORAL_TABLET | Freq: Every day | ORAL | 2 refills | Status: DC

## 2019-12-31 NOTE — Progress Notes (Addendum)
Primary Physician/Referring:  [name], MD  Patient ID: Michael Melendez, male    DOB: 11-Jul-1951, 69 y.o.   MRN: [id number]  Chief Complaint  Patient presents with  . Chest Pain  . Abnormal ECG  . Follow-up    stress and echo   HPI:    Michael Melendez  is a 69 y.o. Caucasian male with hypertension, history of kidney stones, hyperlipidemia, prior tobacco use disorder, gout who was seen in the emergency room on October 22, 2019 for atypical chest pain, chest x-ray, high sensitive serum troponins were negative and was discharged home with outpatient follow-up recommendation.  He had mildly abnormal EKG with nonspecific T abnormality, hypertension and hyperlipidemia.    6 weeks ago and seen him with atypical chest pain and underwent stress testing and presents for follow-up.  Also started him on amlodipine both for angina and for hypertension.  He is tolerating this and states that dyspnea has resolved and he has not had any further chest pain and he has continued to exercise fairly regularly without limitation.  Past Medical History:  Diagnosis Date  . Carpal tunnel syndrome    on right pt says surgery 12-24-11 never occurred  . Depression   . Gout   . History of kidney stones    none in years  . Hypertension    Past Surgical History:  Procedure Laterality Date  . ANKLE RECONSTRUCTION Bilateral 1996  . COLONOSCOPY WITH PROPOFOL N/A 07/27/2014   Procedure: COLONOSCOPY WITH PROPOFOL;  Surgeon: Garlan Fair, MD;  Location: [facility];  Service: Endoscopy;  Laterality: N/A;  . ELBOW BURSA SURGERY Right   . SHOULDER ARTHROSCOPY Bilateral 1980   rotator cuff repair   Social History   Socioeconomic History  . Marital status: Widowed    Spouse name: Not on file  . Number of children: 2  . Years of education: Not on file  . Highest education level: Not on file  Occupational History  . Not on file  Tobacco Use  . Smoking status: Never Smoker  . Smokeless tobacco: Never Used    Substance and Sexual Activity  . Alcohol use: Yes    Alcohol/week: 7.0 standard drinks    Types: 7 Glasses of wine per week    Comment: 1 bootle of wine per week  . Drug use: No  . Sexual activity: Not on file  Other Topics Concern  . Not on file  Social History Narrative  . Not on file   Social Determinants of Health   Financial Resource Strain:   . Difficulty of Paying Living Expenses: Not on file  Food Insecurity:   . Worried About Charity fundraiser in the Last Year: Not on file  . Ran Out of Food in the Last Year: Not on file  Transportation Needs:   . Lack of Transportation (Medical): Not on file  . Lack of Transportation (Non-Medical): Not on file  Physical Activity:   . Days of Exercise per Week: Not on file  . Minutes of Exercise per Session: Not on file  Stress:   . Feeling of Stress : Not on file  Social Connections:   . Frequency of Communication with Friends and Family: Not on file  . Frequency of Social Gatherings with Friends and Family: Not on file  . Attends Religious Services: Not on file  . Active Member of Clubs or Organizations: Not on file  . Attends Archivist Meetings: Not on file  .  Marital Status: Not on file  Intimate Partner Violence:   . Fear of Current or Ex-Partner: Not on file  . Emotionally Abused: Not on file  . Physically Abused: Not on file  . Sexually Abused: Not on file   ROS  Review of Systems  Constitution: Positive for weight gain. Negative for malaise/fatigue.  Cardiovascular: Negative for chest pain, dyspnea on exertion, leg swelling and syncope.  Respiratory: Positive for snoring (mild and only when he sleeps on back).   Endocrine: Negative for cold intolerance.  Hematologic/Lymphatic: Does not bruise/bleed easily.  Gastrointestinal: Negative for melena.  Neurological: Negative for headaches and light-headedness.  Psychiatric/Behavioral: Negative for depression and substance abuse.  All other systems reviewed  and are negative.  Objective   Vitals with BMI 12/31/2019 11/13/2019 10/22/2019  Height _0  _1  -  Weight 225 lbs 216 lbs -  BMI 08.67 61.95 -  Systolic 093 267 124  Diastolic 87 91 98  Pulse 74 90 64     Physical Exam  Constitutional:  Well built and muscular and mildly obese  Neck: No thyromegaly present.  Cardiovascular: Normal rate, regular rhythm, normal heart sounds and intact distal pulses. Exam reveals no gallop.  No murmur heard. No leg edema, no JVD.  Pulmonary/Chest: Effort normal and breath sounds normal.  Abdominal: Soft. Bowel sounds are normal.  Musculoskeletal:     Cervical back: Neck supple.  Skin: Skin is warm and dry.   Laboratory examination:   Recent Labs    10/22/19 1840  NA 140  K 3.9  CL 108  CO2 24  GLUCOSE 109*  BUN 23  CREATININE 1.24  CALCIUM 9.3  GFRNONAA 59*  GFRAA >60   CrCl cannot be calculated (Patient's most recent lab result is older than the maximum 21 days allowed.).  CMP Latest Ref Rng & Units 10/22/2019 09/15/2017 01/20/2017  Glucose 70 - 99 mg/dL 109(H) 94 116(H)  BUN 8 - 23 mg/dL _2 Creatinine 0.61 - 1.24 mg/dL 1.24 1.24 1.63(H)  Sodium 135 - 145 mmol/L 140 137 140  Potassium 3.5 - 5.1 mmol/L 3.9 4.1 3.9  Chloride 98 - 111 mmol/L 108 104 107  CO2 22 - 32 mmol/L _3 Calcium 8.9 - 10.3 mg/dL 9.3 9.2 9.6  Total Protein 6.5 - 8.1 g/dL - 6.8 7.3  Total Bilirubin 0.3 - 1.2 mg/dL - 0.8 1.4(H)  Alkaline Phos 38 - 126 U/L - 66 70  AST 15 - 41 U/L - 43(H) 29  ALT 17 - 63 U/L - 30 25   CBC Latest Ref Rng & Units 10/22/2019 09/15/2017 01/20/2017  WBC 4.0 - 10.5 K/uL 6.2 5.4 4.4  Hemoglobin 13.0 - 17.0 g/dL 13.4 12.9(L) 14.0  Hematocrit 39.0 - 52.0 % 39.4 38.0(L) 39.7  Platelets 150 - 400 K/uL 223 189 211   Lipid Panel  No results found for: CHOL, TRIG, HDL, CHOLHDL, VLDL, LDLCALC, LDLDIRECT HEMOGLOBIN A1C No results found for: HGBA1C, MPG TSH No results for input(s): TSH in the last 8760 hours.   Labs  11/16/2019: Total cholesterol 226, triglycerides 252, HDL 56, LDL 120, non-HDL cholesterol 170. BUN 23, creatinine 1.4, eGFR 58 mL.  Serum glucose 94 mg, CMP otherwise normal. TSH normal.  Labs 07/23/2017: Serum glucose 11 mg, BUN 16, creatinine 1.23, eGFR 59 ML.  Medications and allergies   Allergies  Allergen Reactions  . Other Other (See Comments)    Stuffy nose & eye watering.  PET DANDER  Current Outpatient Medications  Medication Instructions  . allopurinol (ZYLOPRIM) 300 mg, Oral, Daily  . amLODipine (NORVASC) 5 mg, Oral, Daily  . atorvastatin (LIPITOR) 10 mg, Oral, Daily  . Eszopiclone 3 mg, Oral, Daily at bedtime  . ibuprofen (ADVIL) 400 mg, Oral, Every 6 hours PRN  . levothyroxine (SYNTHROID) 100 MCG tablet 1 tablet, Oral, Daily  . losartan (COZAAR) 50 mg, Oral, Daily  . Multiple Vitamin (MULTIVITAMIN WITH MINERALS) TABS tablet 1 tablet, Oral, Daily  . sildenafil (VIAGRA) 100 mg, Oral, Daily PRN    Radiology:  No results found. Cardiac Studies:   Echocardiogram 11/18/2019: Moderate concentric hypertrophy of the left ventricle. Left ventricle cavity is normal in size. Normal global wall motion. Normal LV systolic function with EF 55%. Doppler evidence of grade I (impaired) diastolic dysfunction, normal LAP. Left atrial cavity is mildly dilated. Mild (Grade I) mitral regurgitation. Mild tricuspid regurgitation. Estimated pulmonary artery systolic pressure is 24 mmHg.  Lexiscan Tetrofosmin stress test 11/25/2019: No previous exam available for comparison. Lexiscan nuclear stress test performed using 1-day protocol. SPECT images show medium sized, medium intensity decreased tracer uptake, with mild reversibility, in basal inferior/inferoseptal myocardium s/o mild ischemia. Normal wall motion. Stress LVEF 62%.  Intermediate risk stress test.   Assessment     ICD-10-CM   1. Angina pectoris (HCC)  I20.9   2. Primary hypertension  I10 amLODipine (NORVASC) 5 MG  tablet    DISCONTINUED: amLODipine (NORVASC) 10 MG tablet  3. Hypercholesteremia  E78.00 atorvastatin (LIPITOR) 10 MG tablet    Lipid Panel With LDL/HDL Ratio    LDL cholesterol, direct    LDL cholesterol, direct    Lipid Panel With LDL/HDL Ratio  4. Stage 3a chronic kidney disease  N18.31 CMP14+EGFR    CMP14+EGFR  5. Drug-induced erectile dysfunction  N52.2 sildenafil (VIAGRA) 100 MG tablet    EKG November 13, 2019: Normal sinus rhythm with rate of 80 bpm, borderline left atrial abnormality, poor R wave progression, cannot exclude anteroseptal infarct old.  No evidence of ischemia.  Compared to EKG October 22, 2019, ST-T wave abnormality, cannot exclude inferior and lateral ischemia.  Normal QT interval are present.    Recommendations:   Meds ordered this encounter  Medications  . atorvastatin (LIPITOR) 10 MG tablet    Sig: Take 1 tablet (10 mg total) by mouth daily.    Dispense:  30 tablet    Refill:  2  . DISCONTD: amLODipine (NORVASC) 10 MG tablet    Sig: Take 1 tablet (10 mg total) by mouth daily.    Dispense:  90 tablet    Refill:  3  . sildenafil (VIAGRA) 100 MG tablet    Sig: Take 1 tablet (100 mg total) by mouth daily as needed for erectile dysfunction.    Dispense:  15 tablet    Refill:  1  . amLODipine (NORVASC) 5 MG tablet    Sig: Take 1 tablet (5 mg total) by mouth daily.    Dispense:  90 tablet    Refill:  3    Michael Melendez  is a 68 y.o.  Caucasian male with hypertension, hyperlipidemia, prior tobacco use disorder,, abnormal EKG with nonspecific T abnormality presents for f/u of chest pain suggestive of angina.   Labs from PCP reviewed. I had a long discussion regarding use of medications and also statins with regard to renal insufficiency, diabetes mellitus, erectile dysfunction, however I also discussed with him that he has an abnormal stress test and his chest pain  symptoms are worrisome for angina which is responded to simple medical therapy.  Renal insufficiency  due to hypertension discussed.  Patient is willing to start the medications, he was very emotional as he has lost his wife in the past due to cancer and now he is seriously more with one more woman and was also worried about erectile dysfunction due to emotional status as he brings back memories during lovemaking.  Prescribed him Viagra 100 mg PRN.  Will obtain lipid panel, CMP in 2 months. Increase the dose of amlodipine to 10 mg, I have added Atorvastatin 10 mg daily for hyperlipidemia, symptoms of angina has resolved with medical therapy.  We discussed weight loss again.  I will see him back in 3 months and if he remained stable on annual basis.  Addendum: Patient has developed constipation: Will decrease Amlodipine to 5 mg and he will use Metamucil. Will try to loose weight by 10 Lbs.  Will monitor BP.  01/02/20   Adrian Prows, MD, [facility] 01/02/2020, 3:58 PM [facility] [place]

## 2020-01-02 MED ORDER — AMLODIPINE BESYLATE 5 MG PO TABS: 5.0000 mg | ORAL_TABLET | Freq: Every day | ORAL | 3 refills | Status: DC

## 2020-01-02 NOTE — Addendum Note (Signed)
Addended by: Kela Millin on: 01/02/2020 03:58 PM   Modules accepted: Orders

## 2020-01-26 DIAGNOSIS — L905 Scar conditions and fibrosis of skin: Secondary | ICD-10-CM | POA: Diagnosis not present

## 2020-01-26 DIAGNOSIS — Z85828 Personal history of other malignant neoplasm of skin: Secondary | ICD-10-CM | POA: Diagnosis not present

## 2020-01-26 DIAGNOSIS — L819 Disorder of pigmentation, unspecified: Secondary | ICD-10-CM | POA: Diagnosis not present

## 2020-01-26 DIAGNOSIS — L570 Actinic keratosis: Secondary | ICD-10-CM | POA: Diagnosis not present

## 2020-02-01 ENCOUNTER — Ambulatory Visit: Payer: Medicare Other | Attending: Internal Medicine

## 2020-02-01 DIAGNOSIS — Z23 Encounter for immunization: Secondary | ICD-10-CM | POA: Insufficient documentation

## 2020-02-01 NOTE — Progress Notes (Signed)
   Covid-19 Vaccination Clinic  Name:  Michael Melendez    MRN: [id number] DOB: 1951-10-18  02/01/2020  [name] was observed post Covid-19 immunization for 15 minutes without incidence. He was provided with Vaccine Information Sheet and instruction to access the V-Safe system.   [name] was instructed to call 911 with any severe reactions post vaccine: Marland Kitchen Difficulty breathing  . Swelling of your face and throat  . A fast heartbeat  . A bad rash all over your body  . Dizziness and weakness    Immunizations Administered    Name Date Dose VIS Date Route   Pfizer COVID-19 Vaccine 02/01/2020  4:15 PM 0.3 mL 12/04/2019 Intramuscular   Manufacturer: [place]   Lot: [id number]   [place]: [id number]

## 2020-02-06 ENCOUNTER — Other Ambulatory Visit: Payer: Self-pay | Admitting: Cardiology

## 2020-02-06 DIAGNOSIS — I10 Essential (primary) hypertension: Secondary | ICD-10-CM

## 2020-02-15 DIAGNOSIS — Z85828 Personal history of other malignant neoplasm of skin: Secondary | ICD-10-CM | POA: Diagnosis not present

## 2020-02-15 DIAGNOSIS — L570 Actinic keratosis: Secondary | ICD-10-CM | POA: Diagnosis not present

## 2020-02-15 DIAGNOSIS — L819 Disorder of pigmentation, unspecified: Secondary | ICD-10-CM | POA: Diagnosis not present

## 2020-02-15 DIAGNOSIS — L905 Scar conditions and fibrosis of skin: Secondary | ICD-10-CM | POA: Diagnosis not present

## 2020-02-16 ENCOUNTER — Other Ambulatory Visit: Payer: Self-pay | Admitting: Cardiology

## 2020-02-16 DIAGNOSIS — I10 Essential (primary) hypertension: Secondary | ICD-10-CM

## 2020-02-18 DIAGNOSIS — E7800 Pure hypercholesterolemia, unspecified: Secondary | ICD-10-CM | POA: Diagnosis not present

## 2020-02-18 DIAGNOSIS — F329 Major depressive disorder, single episode, unspecified: Secondary | ICD-10-CM | POA: Diagnosis not present

## 2020-02-18 DIAGNOSIS — I10 Essential (primary) hypertension: Secondary | ICD-10-CM | POA: Diagnosis not present

## 2020-02-18 DIAGNOSIS — E039 Hypothyroidism, unspecified: Secondary | ICD-10-CM | POA: Diagnosis not present

## 2020-02-26 ENCOUNTER — Ambulatory Visit: Payer: Medicare Other | Attending: Internal Medicine

## 2020-02-26 DIAGNOSIS — Z23 Encounter for immunization: Secondary | ICD-10-CM | POA: Insufficient documentation

## 2020-02-26 DIAGNOSIS — E7800 Pure hypercholesterolemia, unspecified: Secondary | ICD-10-CM | POA: Diagnosis not present

## 2020-02-26 DIAGNOSIS — N1831 Chronic kidney disease, stage 3a: Secondary | ICD-10-CM | POA: Diagnosis not present

## 2020-02-26 NOTE — Progress Notes (Signed)
   Covid-19 Vaccination Clinic  Name:  Michael Melendez    MRN: [id number] DOB: [birth year]  02/26/2020  [name] was observed post Covid-19 immunization for 15 minutes without incident. He was provided with Vaccine Information Sheet and instruction to access the V-Safe system.   [name] was instructed to call 911 with any severe reactions post vaccine: Marland Kitchen Difficulty breathing  . Swelling of face and throat  . A fast heartbeat  . A bad rash all over body  . Dizziness and weakness   Immunizations Administered    Name Date Dose VIS Date Route   Pfizer COVID-19 Vaccine 02/26/2020  1:25 PM 0.3 mL 12/04/2019 Intramuscular   Manufacturer: [place]   Lot: [id number]   [place]: [id number]

## 2020-02-27 LAB — CMP14+EGFR
ALT: 41 IU/L (ref 0–44)
AST: 33 IU/L (ref 0–40)
Albumin/Globulin Ratio: 1.9 (ref 1.2–2.2)
Albumin: 4.4 g/dL (ref 3.8–4.8)
Alkaline Phosphatase: 108 IU/L (ref 39–117)
BUN/Creatinine Ratio: 13 (ref 10–24)
BUN: 14 mg/dL (ref 8–27)
Bilirubin Total: 0.5 mg/dL (ref 0.0–1.2)
CO2: 21 mmol/L (ref 20–29)
Calcium: 9.5 mg/dL (ref 8.6–10.2)
Chloride: 106 mmol/L (ref 96–106)
Creatinine, Ser: 1.07 mg/dL (ref 0.76–1.27)
GFR calc Af Amer: 81 mL/min/{1.73_m2} (ref >59)
GFR calc non Af Amer: 70 mL/min/{1.73_m2} (ref >59)
Globulin, Total: 2.3 g/dL (ref 1.5–4.5)
Glucose: 90 mg/dL (ref 65–99)
Potassium: 4.5 mmol/L (ref 3.5–5.2)
Sodium: 141 mmol/L (ref 134–144)
Total Protein: 6.7 g/dL (ref 6.0–8.5)

## 2020-02-27 LAB — LIPID PANEL WITH LDL/HDL RATIO
Cholesterol, Total: 184 mg/dL (ref 100–199)
HDL: 68 mg/dL (ref >39)
LDL Chol Calc (NIH): 95 mg/dL (ref 0–99)
LDL/HDL Ratio: 1.4 ratio (ref 0.0–3.6)
Triglycerides: 122 mg/dL (ref 0–149)
VLDL Cholesterol Cal: 21 mg/dL (ref 5–40)

## 2020-02-27 LAB — LDL CHOLESTEROL, DIRECT: LDL Direct: 100 mg/dL — ABNORMAL HIGH (ref 0–99)

## 2020-02-29 ENCOUNTER — Ambulatory Visit: Payer: Medicare Other | Admitting: Cardiology

## 2020-03-01 ENCOUNTER — Other Ambulatory Visit: Payer: Self-pay

## 2020-03-01 DIAGNOSIS — I10 Essential (primary) hypertension: Secondary | ICD-10-CM

## 2020-03-01 DIAGNOSIS — E7800 Pure hypercholesterolemia, unspecified: Secondary | ICD-10-CM

## 2020-03-01 MED ORDER — AMLODIPINE BESYLATE 5 MG PO TABS: 5.0000 mg | ORAL_TABLET | Freq: Every day | ORAL | 3 refills | Status: DC

## 2020-03-01 MED ORDER — ATORVASTATIN CALCIUM 10 MG PO TABS: 10.0000 mg | ORAL_TABLET | Freq: Every day | ORAL | 2 refills | Status: DC

## 2020-03-04 DIAGNOSIS — M549 Dorsalgia, unspecified: Secondary | ICD-10-CM | POA: Diagnosis not present

## 2020-03-04 DIAGNOSIS — M5442 Lumbago with sciatica, left side: Secondary | ICD-10-CM | POA: Diagnosis not present

## 2020-03-04 DIAGNOSIS — M5136 Other intervertebral disc degeneration, lumbar region: Secondary | ICD-10-CM | POA: Diagnosis not present

## 2020-03-04 DIAGNOSIS — M5441 Lumbago with sciatica, right side: Secondary | ICD-10-CM | POA: Diagnosis not present

## 2020-03-04 DIAGNOSIS — M47816 Spondylosis without myelopathy or radiculopathy, lumbar region: Secondary | ICD-10-CM | POA: Diagnosis not present

## 2020-03-13 DIAGNOSIS — H00016 Hordeolum externum left eye, unspecified eyelid: Secondary | ICD-10-CM | POA: Diagnosis not present

## 2020-03-15 ENCOUNTER — Ambulatory Visit: Payer: Medicare Other | Admitting: Cardiology

## 2020-03-15 ENCOUNTER — Encounter: Payer: Self-pay | Admitting: Cardiology

## 2020-03-16 ENCOUNTER — Ambulatory Visit: Payer: Medicare Other | Admitting: Cardiology

## 2020-03-16 ENCOUNTER — Encounter: Payer: Self-pay | Admitting: Cardiology

## 2020-03-16 ENCOUNTER — Other Ambulatory Visit: Payer: Self-pay

## 2020-03-16 VITALS — BP 131/84 | HR 71 | Temp 98.3°F | Ht 68.0 in | Wt 226.0 lb

## 2020-03-16 DIAGNOSIS — E7800 Pure hypercholesterolemia, unspecified: Secondary | ICD-10-CM

## 2020-03-16 DIAGNOSIS — I10 Essential (primary) hypertension: Secondary | ICD-10-CM | POA: Diagnosis not present

## 2020-03-16 DIAGNOSIS — I209 Angina pectoris, unspecified: Secondary | ICD-10-CM | POA: Diagnosis not present

## 2020-03-16 MED ORDER — EZETIMIBE 10 MG PO TABS: 10.0000 mg | ORAL_TABLET | Freq: Every day | ORAL | 2 refills | Status: DC

## 2020-03-16 NOTE — Progress Notes (Signed)
Primary Physician/Referring:  [name], MD  Patient ID: Michael Melendez, male    DOB: 1951-04-29, 69 y.o.   MRN: [id number]  Chief Complaint  Patient presents with  . Hypertension  . Hyperlipidemia   HPI:    Michael Melendez  is a 69 y.o. Caucasian male with hypertension, history of kidney stones, hyperlipidemia, prior tobacco use disorder, gout who was seen in the emergency room on October 22, 2019 for atypical chest pain, chest x-ray, high sensitive serum troponins were negative and was discharged home with outpatient follow-up recommendation.  He had mildly abnormal EKG with nonspecific T abnormality, hypertension and hyperlipidemia.    He is tolerating this and states that dyspnea has resolved and he has not had any further chest pain and he has continued to exercise fairly regularly without limitation.  Past Medical History:  Diagnosis Date  . Carpal tunnel syndrome    on right pt says surgery 12-24-11 never occurred  . Depression   . Gout   . History of kidney stones    none in years  . Hyperlipidemia   . Hypertension    Past Surgical History:  Procedure Laterality Date  . ANKLE RECONSTRUCTION Bilateral 1996  . COLONOSCOPY WITH PROPOFOL N/A 07/27/2014   Procedure: COLONOSCOPY WITH PROPOFOL;  Surgeon: Garlan Fair, MD;  Location: [facility];  Service: Endoscopy;  Laterality: N/A;  . ELBOW BURSA SURGERY Right   . SHOULDER ARTHROSCOPY Bilateral 1980   rotator cuff repair   Social History   Tobacco Use  . Smoking status: Never Smoker  . Smokeless tobacco: Never Used  Substance Use Topics  . Alcohol use: Yes    Alcohol/week: 7.0 standard drinks    Types: 7 Glasses of wine per week    Comment: 1 bootle of wine per week    ROS  Review of Systems  Cardiovascular: Negative for chest pain, dyspnea on exertion and leg swelling.  Respiratory: Positive for snoring.   Gastrointestinal: Negative for melena.   Objective   Vitals with BMI 03/16/2020 03/16/2020 12/31/2019    Height - 5' 8" 5' 8"  Weight - 226 lbs 225 lbs  BMI - 62.70 35.00  Systolic 938 182 993  Diastolic 84 88 87  Pulse 71 70 74     Physical Exam  Constitutional:  Well built and muscular and mildly obese  Cardiovascular: Normal rate, regular rhythm, normal heart sounds and intact distal pulses. Exam reveals no gallop.  No murmur heard. No leg edema, no JVD.  Pulmonary/Chest: Effort normal and breath sounds normal.  Abdominal: Soft. Bowel sounds are normal.   Laboratory examination:   Recent Labs    10/22/19 1840 02/26/20 1024  NA 140 141  K 3.9 4.5  CL 108 106  CO2 24 21  GLUCOSE 109* 90  BUN 23 14  CREATININE 1.24 1.07  CALCIUM 9.3 9.5  GFRNONAA 59* 70  GFRAA >60 81   estimated creatinine clearance is 74.5 mL/min (by C-G formula based on SCr of 1.07 mg/dL).  CMP Latest Ref Rng & Units 02/26/2020 10/22/2019 09/15/2017  Glucose 65 - 99 mg/dL 90 109(H) 94  BUN 8 - 27 mg/dL _0 Creatinine 0.76 - 1.27 mg/dL 1.07 1.24 1.24  Sodium 134 - 144 mmol/L 141 140 137  Potassium 3.5 - 5.2 mmol/L 4.5 3.9 4.1  Chloride 96 - 106 mmol/L 106 108 104  CO2 20 - 29 mmol/L _1 Calcium 8.6 - 10.2 mg/dL 9.5 9.3 9.2  Total Protein 6.0 - 8.5 g/dL 6.7 - 6.8  Total Bilirubin 0.0 - 1.2 mg/dL 0.5 - 0.8  Alkaline Phos 39 - 117 IU/L 108 - 66  AST 0 - 40 IU/L 33 - 43(H)  ALT 0 - 44 IU/L 41 - 30   CBC Latest Ref Rng & Units 10/22/2019 09/15/2017 01/20/2017  WBC 4.0 - 10.5 K/uL 6.2 5.4 4.4  Hemoglobin 13.0 - 17.0 g/dL 13.4 12.9(L) 14.0  Hematocrit 39.0 - 52.0 % 39.4 38.0(L) 39.7  Platelets 150 - 400 K/uL 223 189 211   Lipid Panel     Component Value Date/Time   CHOL 184 02/26/2020 1024   TRIG 122 02/26/2020 1024   HDL 68 02/26/2020 1024   LDLCALC 95 02/26/2020 1024   LDLDIRECT 100 (H) 02/26/2020 1026       NHDL                                                             116  HEMOGLOBIN A1C No results found for: HGBA1C, MPG TSH No results for input(s): TSH in the last 8760 hours.    Labs 11/16/2019: Total cholesterol 226, triglycerides 252, HDL 56, LDL 120, non-HDL cholesterol 170. BUN 23, creatinine 1.4, eGFR 58 mL.  Serum glucose 94 mg, CMP otherwise normal. TSH normal.  Labs 07/23/2017: Serum glucose 11 mg, BUN 16, creatinine 1.23, eGFR 59 ML.  Medications and allergies   Allergies  Allergen Reactions  . Other Other (See Comments)    Stuffy nose & eye watering.  PET DANDER     Current Outpatient Medications  Medication Instructions  . allopurinol (ZYLOPRIM) 300 mg, Oral, Daily  . amLODipine (NORVASC) 5 mg, Oral, Daily  . atorvastatin (LIPITOR) 10 mg, Oral, Daily  . Eszopiclone 3 mg, Oral, Daily at bedtime  . ezetimibe (ZETIA) 10 mg, Oral, Daily after supper  . ibuprofen (ADVIL) 400 mg, Oral, Every 6 hours PRN  . levothyroxine (SYNTHROID) 100 MCG tablet 1 tablet, Oral, Daily  . losartan (COZAAR) 50 mg, Oral, Daily  . Multiple Vitamin (MULTIVITAMIN WITH MINERALS) TABS tablet 1 tablet, Oral, Daily  . sildenafil (VIAGRA) 100 mg, Oral, Daily PRN  . TOLAK 4 % CREA Apply 1 a small amount to skin once a day  Apply all over face, tops of ears daily x4 weeks   Radiology:  No results found. Cardiac Studies:   Echocardiogram 11/18/2019: Moderate concentric hypertrophy of the left ventricle. Left ventricle cavity is normal in size. Normal global wall motion. Normal LV systolic function with EF 55%. Doppler evidence of grade I (impaired) diastolic dysfunction, normal LAP. Left atrial cavity is mildly dilated. Mild (Grade I) mitral regurgitation. Mild tricuspid regurgitation. Estimated pulmonary artery systolic pressure is 24 mmHg.  Lexiscan Tetrofosmin stress test 11/25/2019: No previous exam available for comparison. Lexiscan nuclear stress test performed using 1-day protocol. SPECT images show medium sized, medium intensity decreased tracer uptake, with mild reversibility, in basal inferior/inferoseptal myocardium s/o mild ischemia. Normal wall motion. Stress  LVEF 62%.  Intermediate risk stress test.   Assessment     ICD-10-CM   1. Angina pectoris (HCC)  I20.9   2. Primary hypertension  I10   3. Hypercholesteremia  E78.00 ezetimibe (ZETIA) 10 MG tablet    Lipid Panel With LDL/HDL Ratio    Lipid Panel  With LDL/HDL Ratio    EKG November 13, 2019: Normal sinus rhythm with rate of 80 bpm, borderline left atrial abnormality, poor R wave progression, cannot exclude anteroseptal infarct old.  No evidence of ischemia.  Compared to EKG October 22, 2019, ST-T wave abnormality, cannot exclude inferior and lateral ischemia.  Normal QT interval are present.    Recommendations:   Meds ordered this encounter  Medications  . ezetimibe (ZETIA) 10 MG tablet    Sig: Take 1 tablet (10 mg total) by mouth daily after supper.    Dispense:  30 tablet    Refill:  2    Michael Melendez  is a 69 y.o.  Caucasian male with hypertension, hyperlipidemia, prior tobacco use disorder,, abnormal EKG with nonspecific T abnormality presents for f/u of chest pain suggestive of angina.  Since last office visit 2 months ago, he has not had any recurrence of angina pectoris.  He is tolerating all his medications well.  Blood pressure is also well controlled.  I reviewed his lipids, non-HDL cholesterol and also LDL is not at goal especially in view of abnormal stress test allergy 10 mg daily, we will recheck his lipids in 3 months, I will see him back on an annual basis.  Weight loss again discussed with the patient.   Adrian Prows, MD, [facility] 03/16/2020, 9:25 AM [facility]

## 2020-03-17 DIAGNOSIS — I10 Essential (primary) hypertension: Secondary | ICD-10-CM | POA: Diagnosis not present

## 2020-03-17 DIAGNOSIS — G4700 Insomnia, unspecified: Secondary | ICD-10-CM | POA: Diagnosis not present

## 2020-03-17 DIAGNOSIS — H00025 Hordeolum internum left lower eyelid: Secondary | ICD-10-CM | POA: Diagnosis not present

## 2020-04-06 ENCOUNTER — Ambulatory Visit: Payer: Medicare Other | Attending: Neurosurgery | Admitting: Physical Therapy

## 2020-04-27 DIAGNOSIS — M65322 Trigger finger, left index finger: Secondary | ICD-10-CM | POA: Diagnosis not present

## 2020-05-02 DIAGNOSIS — H10412 Chronic giant papillary conjunctivitis, left eye: Secondary | ICD-10-CM | POA: Diagnosis not present

## 2020-05-11 DIAGNOSIS — M109 Gout, unspecified: Secondary | ICD-10-CM | POA: Diagnosis not present

## 2020-05-11 DIAGNOSIS — I10 Essential (primary) hypertension: Secondary | ICD-10-CM | POA: Diagnosis not present

## 2020-05-11 DIAGNOSIS — R1110 Vomiting, unspecified: Secondary | ICD-10-CM | POA: Diagnosis not present

## 2020-05-11 DIAGNOSIS — I499 Cardiac arrhythmia, unspecified: Secondary | ICD-10-CM | POA: Diagnosis not present

## 2020-05-11 DIAGNOSIS — K5732 Diverticulitis of large intestine without perforation or abscess without bleeding: Secondary | ICD-10-CM | POA: Diagnosis not present

## 2020-05-11 DIAGNOSIS — E785 Hyperlipidemia, unspecified: Secondary | ICD-10-CM | POA: Diagnosis not present

## 2020-05-20 DIAGNOSIS — F329 Major depressive disorder, single episode, unspecified: Secondary | ICD-10-CM | POA: Diagnosis not present

## 2020-05-20 DIAGNOSIS — E7800 Pure hypercholesterolemia, unspecified: Secondary | ICD-10-CM | POA: Diagnosis not present

## 2020-05-20 DIAGNOSIS — G4700 Insomnia, unspecified: Secondary | ICD-10-CM | POA: Diagnosis not present

## 2020-05-20 DIAGNOSIS — F324 Major depressive disorder, single episode, in partial remission: Secondary | ICD-10-CM | POA: Diagnosis not present

## 2020-05-20 DIAGNOSIS — I10 Essential (primary) hypertension: Secondary | ICD-10-CM | POA: Diagnosis not present

## 2020-05-20 DIAGNOSIS — E039 Hypothyroidism, unspecified: Secondary | ICD-10-CM | POA: Diagnosis not present

## 2020-05-25 ENCOUNTER — Telehealth: Payer: Self-pay

## 2020-05-25 NOTE — Telephone Encounter (Signed)
Stop for 2 weeks and then start every other day

## 2020-05-25 NOTE — Telephone Encounter (Signed)
Pt called ans said that he has been having severe cramping all over. He thinks its his Statin. He does not want to take it and see if his symptoms improve.

## 2020-06-08 ENCOUNTER — Other Ambulatory Visit: Payer: Self-pay | Admitting: Cardiology

## 2020-06-08 DIAGNOSIS — E7800 Pure hypercholesterolemia, unspecified: Secondary | ICD-10-CM

## 2020-06-08 DIAGNOSIS — K5903 Drug induced constipation: Secondary | ICD-10-CM | POA: Diagnosis not present

## 2020-06-08 DIAGNOSIS — R9439 Abnormal result of other cardiovascular function study: Secondary | ICD-10-CM | POA: Diagnosis not present

## 2020-06-08 DIAGNOSIS — E039 Hypothyroidism, unspecified: Secondary | ICD-10-CM | POA: Diagnosis not present

## 2020-06-08 DIAGNOSIS — F324 Major depressive disorder, single episode, in partial remission: Secondary | ICD-10-CM | POA: Diagnosis not present

## 2020-08-03 DIAGNOSIS — M19072 Primary osteoarthritis, left ankle and foot: Secondary | ICD-10-CM | POA: Diagnosis not present

## 2020-09-01 ENCOUNTER — Other Ambulatory Visit: Payer: Self-pay | Admitting: Cardiology

## 2020-09-01 DIAGNOSIS — I10 Essential (primary) hypertension: Secondary | ICD-10-CM

## 2020-09-01 MED ORDER — ACEBUTOLOL HCL 200 MG PO CAPS: 200.0000 mg | ORAL_CAPSULE | Freq: Two times a day (BID) | ORAL | 3 refills | Status: DC

## 2020-09-02 DIAGNOSIS — K5792 Diverticulitis of intestine, part unspecified, without perforation or abscess without bleeding: Secondary | ICD-10-CM | POA: Diagnosis not present

## 2020-09-07 ENCOUNTER — Emergency Department (HOSPITAL_COMMUNITY)
Admission: EM | Admit: 2020-09-07 | Discharge: 2020-09-08 | Disposition: A | Discharge status: Home / Self Care | Payer: Medicare Other | Attending: Emergency Medicine | Admitting: Emergency Medicine

## 2020-09-07 ENCOUNTER — Encounter (HOSPITAL_COMMUNITY): Payer: Self-pay

## 2020-09-07 ENCOUNTER — Other Ambulatory Visit: Payer: Self-pay

## 2020-09-07 DIAGNOSIS — T783XXA Angioneurotic edema, initial encounter: Secondary | ICD-10-CM

## 2020-09-07 DIAGNOSIS — Z79890 Hormone replacement therapy (postmenopausal): Secondary | ICD-10-CM | POA: Diagnosis not present

## 2020-09-07 DIAGNOSIS — Z79899 Other long term (current) drug therapy: Secondary | ICD-10-CM | POA: Diagnosis not present

## 2020-09-07 DIAGNOSIS — I10 Essential (primary) hypertension: Secondary | ICD-10-CM | POA: Diagnosis not present

## 2020-09-07 DIAGNOSIS — T7840XA Allergy, unspecified, initial encounter: Secondary | ICD-10-CM | POA: Diagnosis present

## 2020-09-07 HISTORY — DX: Diverticulitis of intestine, part unspecified, without perforation or abscess without bleeding: K57.92

## 2020-09-07 MED ORDER — FAMOTIDINE IN NACL 20-0.9 MG/50ML-% IV SOLN
20.0000 mg | Freq: Once | INTRAVENOUS | Status: AC
  Administered 2020-09-08: 20 mg via INTRAVENOUS
  Filled 2020-09-07: qty 50

## 2020-09-07 MED ORDER — METHYLPREDNISOLONE SODIUM SUCC 125 MG IJ SOLR
125.0000 mg | Freq: Once | INTRAMUSCULAR | Status: AC
  Administered 2020-09-08: 125 mg via INTRAVENOUS
  Filled 2020-09-07: qty 2

## 2020-09-07 NOTE — ED Triage Notes (Signed)
Patient arrived with complaints of an allergic reaction to an antibiotic. States around 545pm he began having upper lip swelling and feels hoarse.

## 2020-09-07 NOTE — ED Provider Notes (Signed)
[facility] DEPT Provider Note   CSN: [id number] Arrival date & time: 09/07/20  2240     History Chief Complaint  Patient presents with  . Allergic Reaction    Michael Melendez is a 69 y.o. male.  Patient to ED with sudden onset of swelling of his upper lip and tingling sensation in his tongue like it is beginning to swell as well. No difficulty swallowing or breathing. No rash. He reports starting Cipro and Flagyl 5 days ago for acute diverticulitis. He has taken both of these in the past without reaction. He also reports being on long-term Lisinopril. Symptoms started around 5:45 this afternoon. He reports taking 50 mg Benadryl just prior to arrival.   The history is provided by the patient. No language interpreter was used.  Allergic Reaction Presenting symptoms: no difficulty swallowing and no wheezing        Past Medical History:  Diagnosis Date  . Carpal tunnel syndrome    on right pt says surgery 12-24-11 never occurred  . Depression   . Diverticulitis   . Gout   . History of kidney stones    none in years  . Hyperlipidemia   . Hypertension     There are no problems to display for this patient.   Past Surgical History:  Procedure Laterality Date  . ANKLE RECONSTRUCTION Bilateral 1996  . COLONOSCOPY WITH PROPOFOL N/A 07/27/2014   Procedure: COLONOSCOPY WITH PROPOFOL;  Surgeon: Garlan Fair, MD;  Location: [facility];  Service: Endoscopy;  Laterality: N/A;  . ELBOW BURSA SURGERY Right   . SHOULDER ARTHROSCOPY Bilateral 1980   rotator cuff repair       Family History  Problem Relation Age of Onset  . Dementia Mother   . AAA (abdominal aortic aneurysm) Father   . Dementia Father   . AAA (abdominal aortic aneurysm) Sister   . Atrial fibrillation Sister   . Cervical cancer Sister   . Anesthesia problems Neg Hx     Social History   Tobacco Use  . Smoking status: Never Smoker  . Smokeless tobacco: Never Used  Vaping Use   . Vaping Use: Never used  Substance Use Topics  . Alcohol use: Yes    Alcohol/week: 7.0 standard drinks    Types: 7 Glasses of wine per week    Comment: 1 bootle of wine per week  . Drug use: No    Home Medications Prior to Admission medications   Medication Sig Start Date End Date Taking? Authorizing Provider  acebutolol (SECTRAL) 200 MG capsule Take 1 capsule (200 mg total) by mouth 2 (two) times daily. 09/01/20   Adrian Prows, MD  allopurinol (ZYLOPRIM) 300 MG tablet Take 300 mg by mouth daily.     [provider]  atorvastatin (LIPITOR) 10 MG tablet Take 1 tablet (10 mg total) by mouth daily. 03/01/20 05/30/20  Adrian Prows, MD  Eszopiclone 3 MG TABS Take 3 mg by mouth at bedtime. 08/30/17   [provider]  ezetimibe (ZETIA) 10 MG tablet TAKE 1 TABLET (10 MG TOTAL) BY MOUTH DAILY AFTER SUPPER. 06/08/20 09/06/20  Adrian Prows, MD  ibuprofen (ADVIL,MOTRIN) 200 MG tablet Take 400 mg by mouth every 6 (six) hours as needed (pain).    [provider]  levothyroxine (SYNTHROID) 100 MCG tablet Take 1 tablet by mouth daily. 11/25/19   [provider]  losartan (COZAAR) 50 MG tablet Take 50 mg by mouth daily. 08/28/17   [provider]  Multiple Vitamin (MULTIVITAMIN WITH MINERALS) TABS tablet Take 1 tablet by mouth daily.    [provider]  sildenafil (VIAGRA) 100 MG tablet Take 1 tablet (100 mg total) by mouth daily as needed for erectile dysfunction. 12/31/19   Adrian Prows, MD  TOLAK 4 % CREA Apply 1 a small amount to skin once a day  Apply all over face, tops of ears daily x4 weeks 01/26/20   [provider]    Allergies    Other  Review of Systems   Review of Systems  Constitutional: Negative for chills and fever.  HENT: Positive for facial swelling. Negative for trouble swallowing.   Respiratory: Negative.  Negative for cough, shortness of breath and wheezing.   Cardiovascular: Negative.   Gastrointestinal: Negative.   Musculoskeletal:  Negative.   Skin: Negative.   Neurological: Negative.     Physical Exam Updated Vital Signs BP 131/79 (BP Location: Left Arm)   Pulse 63   Temp (!) 97.5 F (36.4 C) (Oral)   Resp 17   SpO2 97%   Physical Exam Vitals and nursing note reviewed.  Constitutional:      General: He is not in acute distress.    Appearance: He is well-developed.  HENT:     Head: Normocephalic.     Comments: Mild swelling of upper lip. No intraoral swelling.     Mouth/Throat:     Mouth: Mucous membranes are moist.  Cardiovascular:     Rate and Rhythm: Normal rate and regular rhythm.     Heart sounds: No murmur heard.   Pulmonary:     Effort: Pulmonary effort is normal.     Breath sounds: Normal breath sounds. No stridor. No wheezing, rhonchi or rales.  Abdominal:     General: Bowel sounds are normal.     Palpations: Abdomen is soft.     Tenderness: There is no abdominal tenderness. There is no guarding or rebound.  Musculoskeletal:        General: Normal range of motion.     Cervical back: Normal range of motion and neck supple.  Skin:    General: Skin is warm and dry.     Findings: No rash.  Neurological:     Mental Status: He is alert and oriented to person, place, and time.     ED Results / Procedures / Treatments   Labs (all labs ordered are listed, but only abnormal results are displayed) Labs Reviewed - No data to display  EKG None  Radiology No results found.  Procedures Procedures (including critical care time)  Medications Ordered in ED Medications - No data to display  ED Course  I have reviewed the triage vital signs and the nursing notes.  Pertinent labs & imaging results that were available during my care of the patient were reviewed by me and considered in my medical decision making (see chart for details).  Clinical Course as of Sep 08 112  Wed Sep 07, 2020  2342 Recheck - no new swelling of tongue. No progression of upper lip swelling.   [SU]  Thu Sep 08, 2020  0030 Re-check - no new swelling, no worsening of lip swelling.    [SU]  0113 No change in symptoms.    [SU]    Clinical Course User Index [SU] Charlann Lange, PA-C   MDM Rules/Calculators/A&P  Patient to ED with facial swelling as per HPI. No SoB or difficulty swallowing.   Angioedema secondary to ACE vs acute allergic reaction in evolution. Favor ACE inhibitor related, however, since he newly on abx, solumedrol and Pepcid provided.   Patient observed for nearly 2 hours. He is requesting discharge home, "I feel fine". He has been seen by Dr. Randal Buba who feels symptoms related to ACE. Will recommend he stop this medication and follow up with his cardiologist for further management.   Final Clinical Impression(s) / ED Diagnoses Final diagnoses:  None   1. Angioedema  Rx / DC Orders ED Discharge Orders    None       Dennie Bible 09/08/20 0117    Palumbo, April, MD 09/08/20 9914

## 2020-09-08 DIAGNOSIS — T783XXA Angioneurotic edema, initial encounter: Secondary | ICD-10-CM | POA: Diagnosis not present

## 2020-09-08 LAB — BASIC METABOLIC PANEL
Anion gap: 8 (ref 5–15)
BUN: 25 mg/dL — ABNORMAL HIGH (ref 8–23)
CO2: 22 mmol/L (ref 22–32)
Calcium: 9 mg/dL (ref 8.9–10.3)
Chloride: 108 mmol/L (ref 98–111)
Creatinine, Ser: 1.26 mg/dL — ABNORMAL HIGH (ref 0.61–1.24)
GFR calc Af Amer: >60 mL/min (ref >60)
GFR calc non Af Amer: 57 mL/min — ABNORMAL LOW (ref >60)
Glucose, Bld: 113 mg/dL — ABNORMAL HIGH (ref 70–99)
Potassium: 4.3 mmol/L (ref 3.5–5.1)
Sodium: 138 mmol/L (ref 135–145)

## 2020-09-08 LAB — CBC WITH DIFFERENTIAL/PLATELET
Abs Immature Granulocytes: 0.01 10*3/uL (ref 0.00–0.07)
Basophils Absolute: 0.1 10*3/uL (ref 0.0–0.1)
Basophils Relative: 1 %
Eosinophils Absolute: 0.2 10*3/uL (ref 0.0–0.5)
Eosinophils Relative: 3 %
HCT: 38.2 % — ABNORMAL LOW (ref 39.0–52.0)
Hemoglobin: 13.2 g/dL (ref 13.0–17.0)
Immature Granulocytes: 0 %
Lymphocytes Relative: 26 %
Lymphs Abs: 1.5 10*3/uL (ref 0.7–4.0)
MCH: 32.6 pg (ref 26.0–34.0)
MCHC: 34.6 g/dL (ref 30.0–36.0)
MCV: 94.3 fL (ref 80.0–100.0)
Monocytes Absolute: 0.8 10*3/uL (ref 0.1–1.0)
Monocytes Relative: 14 %
Neutro Abs: 3.1 10*3/uL (ref 1.7–7.7)
Neutrophils Relative %: 56 %
Platelets: 232 10*3/uL (ref 150–400)
RBC: 4.05 MIL/uL — ABNORMAL LOW (ref 4.22–5.81)
RDW: 13.9 % (ref 11.5–15.5)
WBC: 5.6 10*3/uL (ref 4.0–10.5)
nRBC: 0 % (ref 0.0–0.2)

## 2020-09-08 NOTE — Discharge Instructions (Addendum)
It is recommended that you stop taking your ACE inhibitor as this is felt to be the cause of your symptoms. Follow up with your doctor for a replacement medication recommendation.   Return to the ED with any new or worsening symptoms.

## 2020-09-12 ENCOUNTER — Other Ambulatory Visit: Payer: Self-pay

## 2020-09-12 DIAGNOSIS — I10 Essential (primary) hypertension: Secondary | ICD-10-CM | POA: Diagnosis not present

## 2020-09-12 DIAGNOSIS — K5900 Constipation, unspecified: Secondary | ICD-10-CM | POA: Diagnosis not present

## 2020-09-12 DIAGNOSIS — T783XXD Angioneurotic edema, subsequent encounter: Secondary | ICD-10-CM | POA: Diagnosis not present

## 2020-09-12 DIAGNOSIS — M1A00X0 Idiopathic chronic gout, unspecified site, without tophus (tophi): Secondary | ICD-10-CM | POA: Diagnosis not present

## 2020-09-12 DIAGNOSIS — E7800 Pure hypercholesterolemia, unspecified: Secondary | ICD-10-CM

## 2020-09-12 DIAGNOSIS — R1032 Left lower quadrant pain: Secondary | ICD-10-CM | POA: Diagnosis not present

## 2020-09-12 DIAGNOSIS — L509 Urticaria, unspecified: Secondary | ICD-10-CM | POA: Diagnosis not present

## 2020-09-12 MED ORDER — EZETIMIBE 10 MG PO TABS: 10.0000 mg | ORAL_TABLET | Freq: Every day | ORAL | 7 refills | Status: DC

## 2020-09-20 ENCOUNTER — Other Ambulatory Visit: Payer: Self-pay | Admitting: Internal Medicine

## 2020-09-20 DIAGNOSIS — K5732 Diverticulitis of large intestine without perforation or abscess without bleeding: Secondary | ICD-10-CM | POA: Diagnosis not present

## 2020-09-20 DIAGNOSIS — R1032 Left lower quadrant pain: Secondary | ICD-10-CM | POA: Diagnosis not present

## 2020-09-21 ENCOUNTER — Ambulatory Visit (HOSPITAL_COMMUNITY)
Admission: RE | Admit: 2020-09-21 | Discharge: 2020-09-21 | Disposition: A | Discharge status: Home / Self Care | Payer: Medicare Other | Source: Ambulatory Visit | Attending: Internal Medicine | Admitting: Internal Medicine

## 2020-09-21 ENCOUNTER — Other Ambulatory Visit: Payer: Self-pay

## 2020-09-21 DIAGNOSIS — K5730 Diverticulosis of large intestine without perforation or abscess without bleeding: Secondary | ICD-10-CM | POA: Diagnosis not present

## 2020-09-21 DIAGNOSIS — R1032 Left lower quadrant pain: Secondary | ICD-10-CM | POA: Insufficient documentation

## 2020-09-21 DIAGNOSIS — K3189 Other diseases of stomach and duodenum: Secondary | ICD-10-CM | POA: Diagnosis not present

## 2020-09-21 DIAGNOSIS — I700 Atherosclerosis of aorta: Secondary | ICD-10-CM | POA: Diagnosis not present

## 2020-09-21 DIAGNOSIS — K4020 Bilateral inguinal hernia, without obstruction or gangrene, not specified as recurrent: Secondary | ICD-10-CM | POA: Diagnosis not present

## 2020-09-21 MED ORDER — IOHEXOL 300 MG/ML  SOLN
100.0000 mL | Freq: Once | INTRAMUSCULAR | Status: AC | PRN
  Administered 2020-09-21: 100 mL via INTRAVENOUS

## 2020-09-22 DIAGNOSIS — K5792 Diverticulitis of intestine, part unspecified, without perforation or abscess without bleeding: Secondary | ICD-10-CM | POA: Diagnosis not present

## 2020-10-13 ENCOUNTER — Ambulatory Visit
Admission: RE | Admit: 2020-10-13 | Discharge: 2020-10-13 | Disposition: A | Discharge status: Home / Self Care | Payer: Medicare Other | Source: Ambulatory Visit | Attending: Internal Medicine | Admitting: Internal Medicine

## 2020-10-13 ENCOUNTER — Other Ambulatory Visit: Payer: Self-pay | Admitting: Internal Medicine

## 2020-10-13 DIAGNOSIS — R1030 Lower abdominal pain, unspecified: Secondary | ICD-10-CM | POA: Diagnosis not present

## 2020-10-13 DIAGNOSIS — K5792 Diverticulitis of intestine, part unspecified, without perforation or abscess without bleeding: Secondary | ICD-10-CM | POA: Diagnosis not present

## 2020-10-13 DIAGNOSIS — I878 Other specified disorders of veins: Secondary | ICD-10-CM | POA: Diagnosis not present

## 2020-10-18 DIAGNOSIS — K5900 Constipation, unspecified: Secondary | ICD-10-CM | POA: Diagnosis not present

## 2020-10-18 DIAGNOSIS — K5792 Diverticulitis of intestine, part unspecified, without perforation or abscess without bleeding: Secondary | ICD-10-CM | POA: Diagnosis not present

## 2020-10-18 DIAGNOSIS — R1032 Left lower quadrant pain: Secondary | ICD-10-CM | POA: Diagnosis not present

## 2020-10-21 ENCOUNTER — Other Ambulatory Visit: Payer: Self-pay | Admitting: *Deleted

## 2020-10-21 ENCOUNTER — Ambulatory Visit
Admission: RE | Admit: 2020-10-21 | Discharge: 2020-10-21 | Disposition: A | Discharge status: Home / Self Care | Payer: Medicare Other | Source: Ambulatory Visit | Attending: Physician Assistant | Admitting: Physician Assistant

## 2020-10-21 ENCOUNTER — Other Ambulatory Visit: Payer: Self-pay | Admitting: Physician Assistant

## 2020-10-21 ENCOUNTER — Encounter (HOSPITAL_COMMUNITY): Payer: Self-pay | Admitting: Emergency Medicine

## 2020-10-21 ENCOUNTER — Inpatient Hospital Stay (HOSPITAL_COMMUNITY)
Admission: EM | Admit: 2020-10-21 | Discharge: 2020-10-23 | DRG: 392 | Disposition: A | Discharge status: Home / Self Care | Payer: Medicare Other | Attending: Internal Medicine | Admitting: Internal Medicine

## 2020-10-21 ENCOUNTER — Other Ambulatory Visit: Payer: Self-pay

## 2020-10-21 DIAGNOSIS — Z20822 Contact with and (suspected) exposure to covid-19: Secondary | ICD-10-CM | POA: Diagnosis not present

## 2020-10-21 DIAGNOSIS — Z888 Allergy status to other drugs, medicaments and biological substances status: Secondary | ICD-10-CM | POA: Diagnosis not present

## 2020-10-21 DIAGNOSIS — Z79890 Hormone replacement therapy (postmenopausal): Secondary | ICD-10-CM

## 2020-10-21 DIAGNOSIS — K5732 Diverticulitis of large intestine without perforation or abscess without bleeding: Secondary | ICD-10-CM | POA: Diagnosis present

## 2020-10-21 DIAGNOSIS — F419 Anxiety disorder, unspecified: Secondary | ICD-10-CM | POA: Diagnosis present

## 2020-10-21 DIAGNOSIS — M109 Gout, unspecified: Secondary | ICD-10-CM | POA: Diagnosis present

## 2020-10-21 DIAGNOSIS — K5730 Diverticulosis of large intestine without perforation or abscess without bleeding: Secondary | ICD-10-CM | POA: Diagnosis not present

## 2020-10-21 DIAGNOSIS — K820 Obstruction of gallbladder: Secondary | ICD-10-CM | POA: Diagnosis not present

## 2020-10-21 DIAGNOSIS — I10 Essential (primary) hypertension: Secondary | ICD-10-CM | POA: Diagnosis present

## 2020-10-21 DIAGNOSIS — Z87442 Personal history of urinary calculi: Secondary | ICD-10-CM | POA: Diagnosis not present

## 2020-10-21 DIAGNOSIS — Z79899 Other long term (current) drug therapy: Secondary | ICD-10-CM

## 2020-10-21 DIAGNOSIS — K8689 Other specified diseases of pancreas: Secondary | ICD-10-CM | POA: Diagnosis not present

## 2020-10-21 DIAGNOSIS — Z8049 Family history of malignant neoplasm of other genital organs: Secondary | ICD-10-CM | POA: Diagnosis not present

## 2020-10-21 DIAGNOSIS — K5900 Constipation, unspecified: Secondary | ICD-10-CM | POA: Diagnosis present

## 2020-10-21 DIAGNOSIS — K5792 Diverticulitis of intestine, part unspecified, without perforation or abscess without bleeding: Secondary | ICD-10-CM

## 2020-10-21 DIAGNOSIS — E785 Hyperlipidemia, unspecified: Secondary | ICD-10-CM | POA: Diagnosis present

## 2020-10-21 DIAGNOSIS — Z8249 Family history of ischemic heart disease and other diseases of the circulatory system: Secondary | ICD-10-CM | POA: Diagnosis not present

## 2020-10-21 DIAGNOSIS — R1032 Left lower quadrant pain: Secondary | ICD-10-CM | POA: Diagnosis not present

## 2020-10-21 DIAGNOSIS — K529 Noninfective gastroenteritis and colitis, unspecified: Secondary | ICD-10-CM | POA: Diagnosis not present

## 2020-10-21 LAB — CBC WITH DIFFERENTIAL/PLATELET
Abs Immature Granulocytes: 0.01 10*3/uL (ref 0.00–0.07)
Basophils Absolute: 0 10*3/uL (ref 0.0–0.1)
Basophils Relative: 1 %
Eosinophils Absolute: 0.2 10*3/uL (ref 0.0–0.5)
Eosinophils Relative: 3 %
HCT: 38.2 % — ABNORMAL LOW (ref 39.0–52.0)
Hemoglobin: 13 g/dL (ref 13.0–17.0)
Immature Granulocytes: 0 %
Lymphocytes Relative: 18 %
Lymphs Abs: 1.3 10*3/uL (ref 0.7–4.0)
MCH: 31.6 pg (ref 26.0–34.0)
MCHC: 34 g/dL (ref 30.0–36.0)
MCV: 92.9 fL (ref 80.0–100.0)
Monocytes Absolute: 0.6 10*3/uL (ref 0.1–1.0)
Monocytes Relative: 8 %
Neutro Abs: 5.2 10*3/uL (ref 1.7–7.7)
Neutrophils Relative %: 70 %
Platelets: 238 10*3/uL (ref 150–400)
RBC: 4.11 MIL/uL — ABNORMAL LOW (ref 4.22–5.81)
RDW: 12.7 % (ref 11.5–15.5)
WBC: 7.3 10*3/uL (ref 4.0–10.5)
nRBC: 0 % (ref 0.0–0.2)

## 2020-10-21 LAB — COMPREHENSIVE METABOLIC PANEL
ALT: 30 U/L (ref 0–44)
AST: 30 U/L (ref 15–41)
Albumin: 3.6 g/dL (ref 3.5–5.0)
Alkaline Phosphatase: 77 U/L (ref 38–126)
Anion gap: 12 (ref 5–15)
BUN: 15 mg/dL (ref 8–23)
CO2: 24 mmol/L (ref 22–32)
Calcium: 9.4 mg/dL (ref 8.9–10.3)
Chloride: 103 mmol/L (ref 98–111)
Creatinine, Ser: 1.2 mg/dL (ref 0.61–1.24)
GFR, Estimated: >60 mL/min (ref >60)
Glucose, Bld: 117 mg/dL — ABNORMAL HIGH (ref 70–99)
Potassium: 3.8 mmol/L (ref 3.5–5.1)
Sodium: 139 mmol/L (ref 135–145)
Total Bilirubin: 0.7 mg/dL (ref 0.3–1.2)
Total Protein: 7 g/dL (ref 6.5–8.1)

## 2020-10-21 LAB — RESPIRATORY PANEL BY RT PCR (FLU A&B, COVID)
Influenza A by PCR: NEGATIVE
Influenza B by PCR: NEGATIVE
SARS Coronavirus 2 by RT PCR: NEGATIVE

## 2020-10-21 LAB — LIPASE, BLOOD: Lipase: 30 U/L (ref 11–51)

## 2020-10-21 LAB — PROCALCITONIN: Procalcitonin: <0.1 ng/mL

## 2020-10-21 MED ORDER — PIPERACILLIN-TAZOBACTAM 3.375 G IVPB 30 MIN
3.3750 g | Freq: Once | INTRAVENOUS | Status: AC
  Administered 2020-10-21: 3.375 g via INTRAVENOUS
  Filled 2020-10-21: qty 50

## 2020-10-21 MED ORDER — ACETAMINOPHEN 325 MG PO TABS
650.0000 mg | ORAL_TABLET | Freq: Four times a day (QID) | ORAL | Status: DC | PRN
  Administered 2020-10-21: 21:00:00 650 mg via ORAL
  Filled 2020-10-21: qty 2

## 2020-10-21 MED ORDER — ACETAMINOPHEN 650 MG RE SUPP: 650.0000 mg | Freq: Four times a day (QID) | RECTAL | Status: DC | PRN

## 2020-10-21 MED ORDER — NON FORMULARY: 3.0000 mg | Freq: Every day | Status: DC

## 2020-10-21 MED ORDER — PIPERACILLIN-TAZOBACTAM 3.375 G IVPB
3.3750 g | Freq: Three times a day (TID) | INTRAVENOUS | Status: DC
  Administered 2020-10-22 – 2020-10-23 (×5): 3.375 g via INTRAVENOUS
  Filled 2020-10-21 (×8): qty 50

## 2020-10-21 MED ORDER — HEPARIN SODIUM (PORCINE) 5000 UNIT/ML IJ SOLN
5000.0000 [IU] | Freq: Three times a day (TID) | INTRAMUSCULAR | Status: DC
  Administered 2020-10-21 – 2020-10-22 (×2): 5000 [IU] via SUBCUTANEOUS
  Filled 2020-10-21 (×3): qty 1

## 2020-10-21 MED ORDER — LEVOTHYROXINE SODIUM 100 MCG PO TABS
100.0000 ug | ORAL_TABLET | Freq: Every day | ORAL | Status: DC
  Administered 2020-10-22 – 2020-10-23 (×2): 100 ug via ORAL
  Filled 2020-10-21 (×2): qty 1

## 2020-10-21 MED ORDER — SODIUM CHLORIDE 0.9 % IV SOLN: INTRAVENOUS | Status: DC

## 2020-10-21 MED ORDER — INFLUENZA VAC A&B SA ADJ QUAD 0.5 ML IM PRSY
0.5000 mL | PREFILLED_SYRINGE | INTRAMUSCULAR | Status: DC | PRN
  Filled 2020-10-21: qty 0.5

## 2020-10-21 MED ORDER — ESZOPICLONE 1 MG PO TABS
3.0000 mg | ORAL_TABLET | Freq: Every day | ORAL | Status: DC
  Administered 2020-10-21 – 2020-10-22 (×2): 3 mg via ORAL
  Filled 2020-10-21: qty 3

## 2020-10-21 MED ORDER — ADULT MULTIVITAMIN W/MINERALS CH
1.0000 | ORAL_TABLET | Freq: Every day | ORAL | Status: DC
  Administered 2020-10-22 – 2020-10-23 (×2): 1 via ORAL
  Filled 2020-10-21 (×2): qty 1

## 2020-10-21 MED ORDER — ONDANSETRON HCL 4 MG/2ML IJ SOLN: 4.0000 mg | Freq: Four times a day (QID) | INTRAMUSCULAR | Status: DC | PRN

## 2020-10-21 MED ORDER — ACEBUTOLOL HCL 200 MG PO CAPS
200.0000 mg | ORAL_CAPSULE | Freq: Two times a day (BID) | ORAL | Status: DC
  Administered 2020-10-21 – 2020-10-23 (×4): 200 mg via ORAL
  Filled 2020-10-21 (×4): qty 1

## 2020-10-21 MED ORDER — ONDANSETRON HCL 4 MG PO TABS: 4.0000 mg | ORAL_TABLET | Freq: Four times a day (QID) | ORAL | Status: DC | PRN

## 2020-10-21 MED ORDER — ZOLPIDEM TARTRATE 5 MG PO TABS: 5.0000 mg | ORAL_TABLET | Freq: Every evening | ORAL | Status: DC | PRN

## 2020-10-21 MED ORDER — ESZOPICLONE 2 MG PO TABS: 3.0000 mg | ORAL_TABLET | Freq: Every day | ORAL | Status: DC

## 2020-10-21 MED ORDER — IOPAMIDOL (ISOVUE-300) INJECTION 61%
100.0000 mL | Freq: Once | INTRAVENOUS | Status: AC | PRN
  Administered 2020-10-21: 100 mL via INTRAVENOUS

## 2020-10-21 MED ORDER — MORPHINE SULFATE (PF) 2 MG/ML IV SOLN: 2.0000 mg | INTRAVENOUS | Status: DC | PRN

## 2020-10-21 MED ORDER — LORAZEPAM 1 MG PO TABS
1.0000 mg | ORAL_TABLET | Freq: Every day | ORAL | Status: DC
  Administered 2020-10-21 – 2020-10-22 (×2): 1 mg via ORAL
  Filled 2020-10-21 (×2): qty 1

## 2020-10-21 NOTE — ED Notes (Signed)
ED TO INPATIENT HANDOFF REPORT  Name/Age/Gender Michael Melendez 69 y.o. male  Code Status Advance Directive Documentation     Most Recent Value  Type of Advance Directive Healthcare Power of Attorney, Living will  Pre-existing out of facility DNR order (yellow form or pink MOST form) --  "MOST" Form in Place? --      Home/SNF/Other Home  Chief Complaint Diverticulitis [K57.92]  Level of Care/Admitting Diagnosis ED Disposition    ED Disposition Condition [place]: [facility] [facility] [100102]  Level of Care: Med-Surg [16]  May admit patient to [facility] or [facility] if equivalent level of care is available:: Yes  Covid Evaluation: Asymptomatic Screening Protocol (No Symptoms)  Diagnosis: Diverticulitis [893734]  Admitting Physician: Rolla Plate [[id number]]  Attending Physician: Rolla Plate [[id number]]  Estimated length of stay: past midnight tomorrow  Certification:: I certify this patient will need inpatient services for at least 2 midnights       Medical History Past Medical History:  Diagnosis Date  . Carpal tunnel syndrome    on right pt says surgery 12-24-11 never occurred  . Depression   . Diverticulitis   . Gout   . History of kidney stones    none in years  . Hyperlipidemia   . Hypertension     Allergies Allergies  Allergen Reactions  . Losartan   . Other Other (See Comments)    Stuffy nose & eye watering.  PET DANDER    IV Location/Drains/Wounds Patient Lines/Drains/Airways Status    Active Line/Drains/Airways    Name Placement date Placement time Site Days   Peripheral IV 10/21/20 Right Antecubital 10/21/20  1711  Antecubital  less than 1          Labs/Imaging Results for orders placed or performed during the hospital encounter of 10/21/20 (from the past 48 hour(s))  Comprehensive metabolic panel     Status: Abnormal   Collection Time: 10/21/20  5:10 PM  Result Value Ref Range   Sodium  139 135 - 145 mmol/L   Potassium 3.8 3.5 - 5.1 mmol/L   Chloride 103 98 - 111 mmol/L   CO2 24 22 - 32 mmol/L   Glucose, Bld 117 (H) 70 - 99 mg/dL    Comment: Glucose reference range applies only to samples taken after fasting for at least 8 hours.   BUN 15 8 - 23 mg/dL   Creatinine, Ser 1.20 0.61 - 1.24 mg/dL   Calcium 9.4 8.9 - 10.3 mg/dL   Total Protein 7.0 6.5 - 8.1 g/dL   Albumin 3.6 3.5 - 5.0 g/dL   AST 30 15 - 41 U/L   ALT 30 0 - 44 U/L   Alkaline Phosphatase 77 38 - 126 U/L   Total Bilirubin 0.7 0.3 - 1.2 mg/dL   GFR, Estimated >60 >60 mL/min    Comment: (NOTE) Calculated using the CKD-EPI Creatinine Equation (2021)    Anion gap 12 5 - 15    Comment: Performed at [facility], [place] [address], [place], [place] [address]  CBC with Differential     Status: Abnormal   Collection Time: 10/21/20  5:10 PM  Result Value Ref Range   WBC 7.3 4.0 - 10.5 K/uL   RBC 4.11 (L) 4.22 - 5.81 MIL/uL   Hemoglobin 13.0 13.0 - 17.0 g/dL   HCT 38.2 (L) 39 - 52 %   MCV 92.9 80.0 - 100.0 fL   MCH 31.6 26.0 -  34.0 pg   MCHC 34.0 30.0 - 36.0 g/dL   RDW 12.7 11.5 - 15.5 %   Platelets 238 150 - 400 K/uL   nRBC 0.0 0.0 - 0.2 %   Neutrophils Relative % 70 %   Neutro Abs 5.2 1.7 - 7.7 K/uL   Lymphocytes Relative 18 %   Lymphs Abs 1.3 0.7 - 4.0 K/uL   Monocytes Relative 8 %   Monocytes Absolute 0.6 0.1 - 1.0 K/uL   Eosinophils Relative 3 %   Eosinophils Absolute 0.2 0.0 - 0.5 K/uL   Basophils Relative 1 %   Basophils Absolute 0.0 0.0 - 0.1 K/uL   Immature Granulocytes 0 %   Abs Immature Granulocytes 0.01 0.00 - 0.07 K/uL    Comment: Performed at [facility], [place] [address] [place], [place] [address]  Lipase, blood     Status: None   Collection Time: 10/21/20  5:10 PM  Result Value Ref Range   Lipase 30 11 - 51 U/L    Comment: Performed at [facility], [place] [address], [place], [place] [address]   CT ABDOMEN PELVIS W  CONTRAST  Result Date: 10/21/2020 CLINICAL DATA:  Re-evaluate colonic diverticulitis. EXAM: CT ABDOMEN AND PELVIS WITH CONTRAST TECHNIQUE: Multidetector CT imaging of the abdomen and pelvis was performed using the standard protocol following bolus administration of intravenous contrast. CONTRAST:  167mL ISOVUE-300 IOPAMIDOL (ISOVUE-300) INJECTION 61% COMPARISON:  CT scan 09/21/2020 FINDINGS: Lower chest: Insert lung bases Hepatobiliary: No worrisome hepatic lesions or intrahepatic biliary dilatation. Tiny low-attenuation lesion in the left hepatic dome area is likely a benign cyst. The gallbladder is mildly contracted. No common bile duct dilatation. Pancreas: Moderate pancreatic atrophy but no mass, inflammation or ductal dilatation. Spleen: Normal size.  No focal lesions. Adrenals/Urinary Tract: The adrenal glands and kidneys are unremarkable and stable. No worrisome renal lesions or hydronephrosis. The bladder is unremarkable. Stomach/Bowel: The stomach, duodenum and small bowel are unremarkable. No acute inflammatory changes, mass lesions or obstructive findings. The terminal ileum is normal. The appendix is normal. Persistent area of fairly marked inflammation involving the upper sigmoid colon/descending colon junction region. Although it is slight improved there is still fairly significant inflammation and enhancement. I do not see a discrete abscess or free air. The remainder of the colon is unremarkable and stable. Vascular/Lymphatic: Stable tortuosity and calcification of the thoracic aorta. No aneurysm or dissection. The major venous structures are unremarkable. No mesenteric or retroperitoneal mass or adenopathy. Reproductive: The prostate gland and seminal vesicles are unremarkable. Other: No pelvic mass or adenopathy. No free pelvic fluid collections. No inguinal mass or adenopathy. No abdominal wall hernia or subcutaneous lesions. Musculoskeletal: No significant bony findings. Stable degenerative  changes involving the spine and hips. IMPRESSION: 1. Slightly improved but persistent area of fairly marked inflammation involving the upper sigmoid colon/descending colon junction region. No abscess or free air. 2. No other significant abdominal/pelvic findings, mass lesions or adenopathy. 3. Aortic atherosclerosis. Aortic Atherosclerosis (ICD10-I70.0). Electronically Signed   By: Marijo Sanes M.D.   On: 10/21/2020 13:40    Pending Labs Unresulted Labs (From admission, onward)          Start     Ordered   10/21/20 1649  Respiratory Panel by RT PCR (Flu A&B, Covid) - Nasopharyngeal Swab  (Tier 2 (TAT 2 hrs))  Once,   STAT       Question Answer Comment  Is this test for diagnosis or screening Screening   Symptomatic for  COVID-19 as defined by CDC No   Hospitalized for COVID-19 No   Admitted to ICU for COVID-19 No   Previously tested for COVID-19 No   Resident in a congregate (group) care setting Unknown   Employed in healthcare setting Unknown   Has patient completed COVID vaccination(s) (2 doses of Pfizer/Moderna 1 dose of [employer]) Unknown      10/21/20 1648   10/21/20 1648  Gastrointestinal Panel by PCR , Stool  (Gastrointestinal Panel by PCR, Stool                                                                                                                                                     *Does Not include CLOSTRIDIUM DIFFICILE testing.**If CDIFF testing is needed, select the C Difficile Quick Screen w PCR reflex order below)  ONCE - STAT,   STAT        10/21/20 1648          Vitals/Pain Today's Vitals   10/21/20 1618 10/21/20 1815 10/21/20 1830 10/21/20 1845  BP:  140/75 (!) 162/75 (!) 151/87  Pulse:  60 65 62  Resp:  (!) 24 (!) 23 (!) 22  Temp:      TempSrc:      SpO2:  98% 98% 98%  Weight:      Height:      PainSc: 3        Isolation Precautions Enteric precautions (UV disinfection)  Medications Medications  morphine 2 MG/ML injection 2 mg (has no  administration in time range)  piperacillin-tazobactam (ZOSYN) IVPB 3.375 g (has no administration in time range)  influenza vaccine adjuvanted (FLUAD) injection 0.5 mL (has no administration in time range)  piperacillin-tazobactam (ZOSYN) IVPB 3.375 g (0 g Intravenous Stopped 10/21/20 1848)    Mobility walks

## 2020-10-21 NOTE — ED Triage Notes (Signed)
Patient has had a diverticulitis flare up x5 weeks, got switched to different abx this week, had a CT scan done today and was told his diverticulitis is still abd and needs IV abx.

## 2020-10-21 NOTE — H&P (Signed)
TRH H&P    Patient Demographics:    Michael Melendez, is a 69 y.o. male  MRN: [id number]  DOB - Nov 14, 1951  Admit Date - 10/21/2020  Referring MD/NP/PA: Langston Masker  Outpatient Primary MD for the patient is [name], MD  Patient coming from: Home  Chief complaint-abdominal pain   HPI:    Michael Melendez  is a 69 y.o. male, with history of hypertension, hyperlipidemia-cannot tolerate statins, diverticulitis, anxiety, and more presents to the ED with a chief complaint of abdominal pain.  The pain is located in his left lower quadrant.  It has been present for 6 weeks.  He describes it as a burning that is intermittent.  He describes it as coming in waves of pain that take him to his knees.  When he has these waves the pain he has the urge to defecate, but cannot produce a bowel movement.  Reports eating aggravates the pain, fasting makes it better.  He does admit to occasional associated fever and chills as well as diaphoresis over the 6 weeks.  The pain usually starts at 3 or 3:30 in the morning, and improves by afternoon.  He is in no pain at the time of my exam.  Patient reports associated constipation for the last 2 months.  He attributes the constipation to a calcium channel blocker that he was on briefly.  His last bowel movement was this morning without straining.  Prior to that a GI friend of his encouraged him to drink a gallon of MiraLAX 1 glass at a time every hour until he had relief, and he reports a large bowel movement at that time earlier this week.  Patient reports that he takes prune juice, Senokot, MiraLAX every night.  Patient reports that he has been following with his general practitioner and [name] for his abdominal pain.  He has had 2 or 3 outpatient CT scans.  Last one was today.  It showed inflammation in the colon.  He has been on several courses of antibiotics including Cipro Flagyl-which was stopped mid  course due to rash, then Augmentin, then amoxicillin, and then back on Augmentin.  [name] told him if he did not have any relief with his last course of Augmentin he would need to go to the hospital for IV antibiotics per his report.  In the ED Temperature 97.8, heart rate 66, respiratory rate 16, blood pressure 133/78, satting at 97% on room air No leukocytosis with a white blood cell count of 7.3, hemoglobin 13.0 Creatinine at baseline at 1.20 Lipase 30 Covid flu pending CT abdomen pelvis -slightly improved but persistent area of fairly marked inflammation involving the upper sigmoid colon descending colon junction region.  No abscess or free air. Zosyn started    Review of systems:    In addition to the HPI above,  No Fever-chills, No Headache, No changes with Vision or hearing, No problems swallowing food or Liquids, No Chest pain, Cough or Shortness of Breath, Positive for abdominal pain, constipation, loss of appetite No Blood in stool or  Urine, No dysuria, No new skin rashes or bruises, No new joints pains-aches,  No new weakness, tingling, numbness in any extremity, No recent weight gain or loss, No polyuria, polydypsia or polyphagia,  significant Mental Stressors -recently married fifth time after the death of last wife  All other systems reviewed and are negative.    Past History of the following :    Past Medical History:  Diagnosis Date  . Carpal tunnel syndrome    on right pt says surgery 12-24-11 never occurred  . Depression   . Diverticulitis   . Gout   . History of kidney stones    none in years  . Hyperlipidemia   . Hypertension       Past Surgical History:  Procedure Laterality Date  . ANKLE RECONSTRUCTION Bilateral 1996  . COLONOSCOPY WITH PROPOFOL N/A 07/27/2014   Procedure: COLONOSCOPY WITH PROPOFOL;  Surgeon: Garlan Fair, MD;  Location: [facility];  Service: Endoscopy;  Laterality: N/A;  . ELBOW BURSA SURGERY Right   . SHOULDER  ARTHROSCOPY Bilateral 1980   rotator cuff repair      Social History:      Social History   Tobacco Use  . Smoking status: Never Smoker  . Smokeless tobacco: Never Used  Substance Use Topics  . Alcohol use: Yes    Alcohol/week: 7.0 standard drinks    Types: 7 Glasses of wine per week    Comment: 1 bootle of wine per week       Family History :     Family History  Problem Relation Age of Onset  . Dementia Mother   . AAA (abdominal aortic aneurysm) Father   . Dementia Father   . AAA (abdominal aortic aneurysm) Sister   . Atrial fibrillation Sister   . Cervical cancer Sister   . Anesthesia problems Neg Hx       Home Medications:   Prior to Admission medications   Medication Sig Start Date End Date Taking? Authorizing Provider  acebutolol (SECTRAL) 200 MG capsule Take 1 capsule (200 mg total) by mouth 2 (two) times daily. 09/01/20   Adrian Prows, MD  allopurinol (ZYLOPRIM) 300 MG tablet Take 300 mg by mouth daily.     [provider]  atorvastatin (LIPITOR) 10 MG tablet Take 1 tablet (10 mg total) by mouth daily. 03/01/20 05/30/20  Adrian Prows, MD  Eszopiclone 3 MG TABS Take 3 mg by mouth at bedtime. 08/30/17   [provider]  ezetimibe (ZETIA) 10 MG tablet Take 1 tablet (10 mg total) by mouth daily after supper. 09/12/20 12/11/20  Adrian Prows, MD  ibuprofen (ADVIL,MOTRIN) 200 MG tablet Take 400 mg by mouth every 6 (six) hours as needed (pain).    [provider]  levothyroxine (SYNTHROID) 100 MCG tablet Take 1 tablet by mouth daily. 11/25/19   [provider]  losartan (COZAAR) 50 MG tablet Take 50 mg by mouth daily. 08/28/17   [provider]  Multiple Vitamin (MULTIVITAMIN WITH MINERALS) TABS tablet Take 1 tablet by mouth daily.    [provider]  sildenafil (VIAGRA) 100 MG tablet Take 1 tablet (100 mg total) by mouth daily as needed for erectile dysfunction. 12/31/19   Adrian Prows, MD  TOLAK 4 % CREA Apply 1 a small amount to  skin once a day  Apply all over face, tops of ears daily x4 weeks 01/26/20   [provider]     Allergies:     Allergies  Allergen Reactions  .  Losartan   . Other Other (See Comments)    Stuffy nose & eye watering.  PET DANDER     Physical Exam:   Vitals  Blood pressure (!) 158/96, pulse (!) 58, temperature 97.8 F (36.6 C), temperature source Oral, resp. rate (!) 21, height 5\' 8"  (1.727 m), weight 95.3 kg, SpO2 99 %.  1.  General: Lying supine in bed in no acute distress, appearing younger than stated age  8. Psychiatric: Mood and behavior normal for situation  3. Neurologic: Cranial nerves II through XII intact, moves all 4 extremities voluntarily, no sensory deficits, no acute focal deficit, alert and oriented x3  4. HEENMT:  Head is atraumatic, normocephalic, pupils reactive to light, neck is supple, mucous membranes are moist, trachea is midline  5. Respiratory : Lungs are clear to auscultation bilaterally  6. Cardiovascular : Heart rate is normal, rhythm is regular, no murmurs rubs or gallops  7. Gastrointestinal:  Abdomen is mildly distended, not nontender at time of exam, no palpable masses, hypoactive bowel sounds  8. Skin:  Healing wound right forearm, no other acute lesion on limited skin exam  9.Musculoskeletal:  No peripheral edema or acute deformity    Data Review:    CBC Recent Labs  Lab 10/21/20 1710  WBC 7.3  HGB 13.0  HCT 38.2*  PLT 238  MCV 92.9  MCH 31.6  MCHC 34.0  RDW 12.7  LYMPHSABS 1.3  MONOABS 0.6  EOSABS 0.2  BASOSABS 0.0   ------------------------------------------------------------------------------------------------------------------  Results for orders placed or performed during the hospital encounter of 10/21/20 (from the past 48 hour(s))  Comprehensive metabolic panel     Status: Abnormal   Collection Time: 10/21/20  5:10 PM  Result Value Ref Range   Sodium 139 135 - 145 mmol/L   Potassium 3.8 3.5 - 5.1  mmol/L   Chloride 103 98 - 111 mmol/L   CO2 24 22 - 32 mmol/L   Glucose, Bld 117 (H) 70 - 99 mg/dL    Comment: Glucose reference range applies only to samples taken after fasting for at least 8 hours.   BUN 15 8 - 23 mg/dL   Creatinine, Ser 1.20 0.61 - 1.24 mg/dL   Calcium 9.4 8.9 - 10.3 mg/dL   Total Protein 7.0 6.5 - 8.1 g/dL   Albumin 3.6 3.5 - 5.0 g/dL   AST 30 15 - 41 U/L   ALT 30 0 - 44 U/L   Alkaline Phosphatase 77 38 - 126 U/L   Total Bilirubin 0.7 0.3 - 1.2 mg/dL   GFR, Estimated >60 >60 mL/min    Comment: (NOTE) Calculated using the CKD-EPI Creatinine Equation (2021)    Anion gap 12 5 - 15    Comment: Performed at [facility], [place] [address] [place], [place] [address]  CBC with Differential     Status: Abnormal   Collection Time: 10/21/20  5:10 PM  Result Value Ref Range   WBC 7.3 4.0 - 10.5 K/uL   RBC 4.11 (L) 4.22 - 5.81 MIL/uL   Hemoglobin 13.0 13.0 - 17.0 g/dL   HCT 38.2 (L) 39 - 52 %   MCV 92.9 80.0 - 100.0 fL   MCH 31.6 26.0 - 34.0 pg   MCHC 34.0 30.0 - 36.0 g/dL   RDW 12.7 11.5 - 15.5 %   Platelets 238 150 - 400 K/uL   nRBC 0.0 0.0 - 0.2 %   Neutrophils Relative % 70 %   Neutro Abs 5.2 1.7 -  7.7 K/uL   Lymphocytes Relative 18 %   Lymphs Abs 1.3 0.7 - 4.0 K/uL   Monocytes Relative 8 %   Monocytes Absolute 0.6 0.1 - 1.0 K/uL   Eosinophils Relative 3 %   Eosinophils Absolute 0.2 0.0 - 0.5 K/uL   Basophils Relative 1 %   Basophils Absolute 0.0 0.0 - 0.1 K/uL   Immature Granulocytes 0 %   Abs Immature Granulocytes 0.01 0.00 - 0.07 K/uL    Comment: Performed at [facility], [place] [address], [place], [place] [address]  Lipase, blood     Status: None   Collection Time: 10/21/20  5:10 PM  Result Value Ref Range   Lipase 30 11 - 51 U/L    Comment: Performed at [facility], [place] [address], [place], [place] [address]    Chemistries  Recent Labs  Lab 10/21/20 1710  NA 139  K 3.8  CL 103  CO2 24   GLUCOSE 117*  BUN 15  CREATININE 1.20  CALCIUM 9.4  AST 30  ALT 30  ALKPHOS 77  BILITOT 0.7   ------------------------------------------------------------------------------------------------------------------  ------------------------------------------------------------------------------------------------------------------ GFR: Estimated Creatinine Clearance: 64.2 mL/min (by C-G formula based on SCr of 1.2 mg/dL). Liver Function Tests: Recent Labs  Lab 10/21/20 1710  AST 30  ALT 30  ALKPHOS 77  BILITOT 0.7  PROT 7.0  ALBUMIN 3.6   Recent Labs  Lab 10/21/20 1710  LIPASE 30   No results for input(s): AMMONIA in the last 168 hours. Coagulation Profile: No results for input(s): INR, PROTIME in the last 168 hours. Cardiac Enzymes: No results for input(s): CKTOTAL, CKMB, CKMBINDEX, TROPONINI in the last 168 hours. BNP (last 3 results) No results for input(s): PROBNP in the last 8760 hours. HbA1C: No results for input(s): HGBA1C in the last 72 hours. CBG: No results for input(s): GLUCAP in the last 168 hours. Lipid Profile: No results for input(s): CHOL, HDL, LDLCALC, TRIG, CHOLHDL, LDLDIRECT in the last 72 hours. Thyroid Function Tests: No results for input(s): TSH, T4TOTAL, FREET4, T3FREE, THYROIDAB in the last 72 hours. Anemia Panel: No results for input(s): VITAMINB12, FOLATE, FERRITIN, TIBC, IRON, RETICCTPCT in the last 72 hours.  --------------------------------------------------------------------------------------------------------------- Urine analysis:    Component Value Date/Time   COLORURINE COLORLESS (A) 09/15/2017 2131   APPEARANCEUR CLEAR 09/15/2017 2131   LABSPEC 1.003 (L) 09/15/2017 2131   PHURINE 6.0 09/15/2017 2131   GLUCOSEU NEGATIVE 09/15/2017 2131   HGBUR SMALL (A) 09/15/2017 2131   BILIRUBINUR NEGATIVE 09/15/2017 2131   KETONESUR NEGATIVE 09/15/2017 2131   PROTEINUR NEGATIVE 09/15/2017 2131   NITRITE NEGATIVE 09/15/2017 2131    LEUKOCYTESUR NEGATIVE 09/15/2017 2131      Imaging Results:    CT ABDOMEN PELVIS W CONTRAST  Result Date: 10/21/2020 CLINICAL DATA:  Re-evaluate colonic diverticulitis. EXAM: CT ABDOMEN AND PELVIS WITH CONTRAST TECHNIQUE: Multidetector CT imaging of the abdomen and pelvis was performed using the standard protocol following bolus administration of intravenous contrast. CONTRAST:  139mL ISOVUE-300 IOPAMIDOL (ISOVUE-300) INJECTION 61% COMPARISON:  CT scan 09/21/2020 FINDINGS: Lower chest: Insert lung bases Hepatobiliary: No worrisome hepatic lesions or intrahepatic biliary dilatation. Tiny low-attenuation lesion in the left hepatic dome area is likely a benign cyst. The gallbladder is mildly contracted. No common bile duct dilatation. Pancreas: Moderate pancreatic atrophy but no mass, inflammation or ductal dilatation. Spleen: Normal size.  No focal lesions. Adrenals/Urinary Tract: The adrenal glands and kidneys are unremarkable and stable. No worrisome renal lesions or hydronephrosis. The bladder is unremarkable. Stomach/Bowel:  The stomach, duodenum and small bowel are unremarkable. No acute inflammatory changes, mass lesions or obstructive findings. The terminal ileum is normal. The appendix is normal. Persistent area of fairly marked inflammation involving the upper sigmoid colon/descending colon junction region. Although it is slight improved there is still fairly significant inflammation and enhancement. I do not see a discrete abscess or free air. The remainder of the colon is unremarkable and stable. Vascular/Lymphatic: Stable tortuosity and calcification of the thoracic aorta. No aneurysm or dissection. The major venous structures are unremarkable. No mesenteric or retroperitoneal mass or adenopathy. Reproductive: The prostate gland and seminal vesicles are unremarkable. Other: No pelvic mass or adenopathy. No free pelvic fluid collections. No inguinal mass or adenopathy. No abdominal wall hernia or  subcutaneous lesions. Musculoskeletal: No significant bony findings. Stable degenerative changes involving the spine and hips. IMPRESSION: 1. Slightly improved but persistent area of fairly marked inflammation involving the upper sigmoid colon/descending colon junction region. No abscess or free air. 2. No other significant abdominal/pelvic findings, mass lesions or adenopathy. 3. Aortic atherosclerosis. Aortic Atherosclerosis (ICD10-I70.0). Electronically Signed   By: Marijo Sanes M.D.   On: 10/21/2020 13:40       Assessment & Plan:    Active Problems:   Diverticulitis   1. Diverticulitis 1. Failed outpatient treatment with several courses of antibiotics 2. CT scan shows  inflammation of the colon 3. Consulted GI for possible scope 4. Zosyn started in the ER-continue 5. Questionable if this is infectious given some many courses of antibiotics, pro-Cal pending 6. N.p.o. 7. Morphine for pain 8. Continue to monitor 2. Hypertension 1. Continue beta-blocker 2. BP at admission is 133/78 3. Anxiety 1. Ativan at night 2. We will consider as needed Ativan if patient is feeling more anxiety 3. Continue to monitor    DVT Prophylaxis-   Heparin- SCDs   AM Labs Ordered, also please review Full Orders  Family Communication: No family at bedside  Code Status: Full  Admission status: Inpatient :The appropriate admission status for this patient is INPATIENT. Inpatient status is judged to be reasonable and necessary in order to provide the required intensity of service to ensure the patient's safety. The patient's presenting symptoms, physical exam findings, and initial radiographic and laboratory data in the context of their chronic comorbidities is felt to place them at high risk for further clinical deterioration. Furthermore, it is not anticipated that the patient will be medically stable for discharge from the hospital within 2 midnights of admission. The following factors support the  admission status of inpatient.     The patient's presenting symptoms include abdominal pain and constipation The initial radiographic and laboratory data are worrisome because of worsening inflammation of the colon on CT The chronic co-morbidities include hypertension, hyperlipidemia, gout, anxiety       * I certify that at the point of admission it is my clinical judgment that the patient will require inpatient hospital care spanning beyond 2 midnights from the point of admission due to high intensity of service, high risk for further deterioration and high frequency of surveillance required.*  Time spent in minutes : [place]

## 2020-10-21 NOTE — ED Provider Notes (Signed)
[place] DEPT Provider Note   CSN: [id number] Arrival date & time: 10/21/20  1607     History Chief Complaint  Patient presents with  . Diverticulitis    Michael Melendez is a 69 y.o. male w/ hx of HTN, HLD presenting to the ED with abdominal pain.  Patient reports he began having symptoms of "Diverticulitis" 6 weeks ago, similar to his prior episodes.  He completed 5 days of cipro + flagyl before developing an itchy purple rash on his back and arms, and stopped these antibiotics in favor of augmentin.  He completed a full course of augmentin.  His abdominal pain never improved.  He was then started on a course of amoxicillin, which he took for a few days, prior to seeing [name] three days ago.  They switched him back to Augmentin ER which he has been taking the past 3 days.  He has not had symptomatic improvement at all.  His abdominal pain is burning and worse in the morning.  It responds somewhat to PO opioid pain meds, but now he feels constipated.  He had a repeat CT scan performed today as an outpatient which showed :  "IMPRESSION: 1. Slightly improved but persistent area of fairly marked inflammation involving the upper sigmoid colon/descending colon junction region. No abscess or free air. 2. No other significant abdominal/pelvic findings, mass lesions or adenopathy. 3. Aortic atherosclerosis."  He was told to come to the hospital for IV antibiotics.   In the past his diverticulitis has cleared after cipro + flagyl.    He denies foreign travel or hx of C. Diff.  He reports a colonoscopy about 3-4 years ago and said "They removed a polyp and I had diverticulosis but nothing else was wrong."   HPI     Past Medical History:  Diagnosis Date  . Carpal tunnel syndrome    on right pt says surgery 12-24-11 never occurred  . Depression   . Diverticulitis   . Gout   . History of kidney stones    none in years  . Hyperlipidemia   .  Hypertension     Patient Active Problem List   Diagnosis Date Noted  . Diverticulitis 10/21/2020    Past Surgical History:  Procedure Laterality Date  . ANKLE RECONSTRUCTION Bilateral 1996  . COLONOSCOPY WITH PROPOFOL N/A 07/27/2014   Procedure: COLONOSCOPY WITH PROPOFOL;  Surgeon: Garlan Fair, MD;  Location: [facility];  Service: Endoscopy;  Laterality: N/A;  . ELBOW BURSA SURGERY Right   . SHOULDER ARTHROSCOPY Bilateral 1980   rotator cuff repair       Family History  Problem Relation Age of Onset  . Dementia Mother   . AAA (abdominal aortic aneurysm) Father   . Dementia Father   . AAA (abdominal aortic aneurysm) Sister   . Atrial fibrillation Sister   . Cervical cancer Sister   . Anesthesia problems Neg Hx     Social History   Tobacco Use  . Smoking status: Never Smoker  . Smokeless tobacco: Never Used  Vaping Use  . Vaping Use: Never used  Substance Use Topics  . Alcohol use: Yes    Alcohol/week: 7.0 standard drinks    Types: 7 Glasses of wine per week    Comment: 1 bootle of wine per week  . Drug use: No    Home Medications Prior to Admission medications   Medication Sig Start Date End Date Taking? Authorizing Provider  acebutolol (SECTRAL) 200 MG  capsule Take 1 capsule (200 mg total) by mouth 2 (two) times daily. 09/01/20  Yes Adrian Prows, MD  acetaminophen (TYLENOL) 500 MG tablet Take 500 mg by mouth every 6 (six) hours as needed for mild pain.   Yes [provider]  allopurinol (ZYLOPRIM) 300 MG tablet Take 300 mg by mouth daily.    Yes [provider]  ALPRAZolam Duanne Moron) 0.5 MG tablet Take 0.5 mg by mouth daily as needed for anxiety.  10/19/20  Yes [provider]  [employer] SENNA PLUS 8.6-50 MG tablet Take 2 tablets by mouth at bedtime as needed for mild constipation.  09/12/20  Yes [provider]  Eszopiclone 3 MG TABS Take 3 mg by mouth at bedtime. 08/30/17  Yes [provider]  ezetimibe (ZETIA) 10 MG tablet  Take 1 tablet (10 mg total) by mouth daily after supper. 09/12/20 12/11/20 Yes Adrian Prows, MD  ibuprofen (ADVIL,MOTRIN) 200 MG tablet Take 400 mg by mouth every 6 (six) hours as needed (pain).   Yes [provider]  levothyroxine (SYNTHROID) 100 MCG tablet Take 1 tablet by mouth daily. 11/25/19  Yes [provider]  Multiple Vitamin (MULTIVITAMIN WITH MINERALS) TABS tablet Take 1 tablet by mouth daily.   Yes [provider]  oxyCODONE (OXY IR/ROXICODONE) 5 MG immediate release tablet Take 5 mg by mouth every 6 (six) hours as needed for moderate pain.  10/16/20  Yes [provider]  atorvastatin (LIPITOR) 10 MG tablet Take 1 tablet (10 mg total) by mouth daily. Patient not taking: Reported on 10/21/2020 03/01/20 05/30/20  Adrian Prows, MD  sildenafil (VIAGRA) 100 MG tablet Take 1 tablet (100 mg total) by mouth daily as needed for erectile dysfunction. Patient not taking: Reported on 10/21/2020 12/31/19   Adrian Prows, MD    Allergies    Losartan and Other  Review of Systems   Review of Systems  Constitutional: Negative for chills and fever.  HENT: Negative for ear pain and sore throat.   Eyes: Negative for pain and visual disturbance.  Respiratory: Negative for cough and shortness of breath.   Cardiovascular: Negative for chest pain and palpitations.  Gastrointestinal: Positive for abdominal pain, constipation, nausea and vomiting.  Genitourinary: Negative for dysuria and hematuria.  Musculoskeletal: Negative for arthralgias and myalgias.  Skin: Negative for color change and rash.  Neurological: Negative for seizures and syncope.  All other systems reviewed and are negative.   Physical Exam Updated Vital Signs BP (!) 167/93 (BP Location: Left Arm)   Pulse 60   Temp 98.2 F (36.8 C) (Oral)   Resp 17   Ht 5\' 8"  (1.727 m)   Wt 95.3 kg   SpO2 96%   BMI 31.93 kg/m   Physical Exam Vitals and nursing note reviewed.  Constitutional:      Appearance: He is  well-developed.  HENT:     Head: Normocephalic and atraumatic.  Eyes:     Conjunctiva/sclera: Conjunctivae normal.  Cardiovascular:     Rate and Rhythm: Normal rate and regular rhythm.     Pulses: Normal pulses.  Pulmonary:     Effort: Pulmonary effort is normal. No respiratory distress.     Breath sounds: Normal breath sounds.  Abdominal:     Palpations: Abdomen is soft.     Tenderness: There is no abdominal tenderness. There is no guarding.  Musculoskeletal:     Cervical back: Neck supple.  Skin:    General: Skin is warm and dry.  Neurological:  General: No focal deficit present.     Mental Status: He is alert and oriented to person, place, and time.  Psychiatric:        Mood and Affect: Mood normal.        Behavior: Behavior normal.     ED Results / Procedures / Treatments   Labs (all labs ordered are listed, but only abnormal results are displayed) Labs Reviewed  COMPREHENSIVE METABOLIC PANEL - Abnormal; Notable for the following components:      Result Value   Glucose, Bld 117 (*)    All other components within normal limits  CBC WITH DIFFERENTIAL/PLATELET - Abnormal; Notable for the following components:   RBC 4.11 (*)    HCT 38.2 (*)    All other components within normal limits  RESPIRATORY PANEL BY RT PCR (FLU A&B, COVID)  GASTROINTESTINAL PANEL BY PCR, STOOL (REPLACES STOOL CULTURE)  LIPASE, BLOOD  PROCALCITONIN  HIV ANTIBODY (ROUTINE TESTING W REFLEX)  TSH  CBC  COMPREHENSIVE METABOLIC PANEL  MAGNESIUM    EKG None  Radiology CT ABDOMEN PELVIS W CONTRAST  Result Date: 10/21/2020 CLINICAL DATA:  Re-evaluate colonic diverticulitis. EXAM: CT ABDOMEN AND PELVIS WITH CONTRAST TECHNIQUE: Multidetector CT imaging of the abdomen and pelvis was performed using the standard protocol following bolus administration of intravenous contrast. CONTRAST:  141mL ISOVUE-300 IOPAMIDOL (ISOVUE-300) INJECTION 61% COMPARISON:  CT scan 09/21/2020 FINDINGS: Lower chest:  Insert lung bases Hepatobiliary: No worrisome hepatic lesions or intrahepatic biliary dilatation. Tiny low-attenuation lesion in the left hepatic dome area is likely a benign cyst. The gallbladder is mildly contracted. No common bile duct dilatation. Pancreas: Moderate pancreatic atrophy but no mass, inflammation or ductal dilatation. Spleen: Normal size.  No focal lesions. Adrenals/Urinary Tract: The adrenal glands and kidneys are unremarkable and stable. No worrisome renal lesions or hydronephrosis. The bladder is unremarkable. Stomach/Bowel: The stomach, duodenum and small bowel are unremarkable. No acute inflammatory changes, mass lesions or obstructive findings. The terminal ileum is normal. The appendix is normal. Persistent area of fairly marked inflammation involving the upper sigmoid colon/descending colon junction region. Although it is slight improved there is still fairly significant inflammation and enhancement. I do not see a discrete abscess or free air. The remainder of the colon is unremarkable and stable. Vascular/Lymphatic: Stable tortuosity and calcification of the thoracic aorta. No aneurysm or dissection. The major venous structures are unremarkable. No mesenteric or retroperitoneal mass or adenopathy. Reproductive: The prostate gland and seminal vesicles are unremarkable. Other: No pelvic mass or adenopathy. No free pelvic fluid collections. No inguinal mass or adenopathy. No abdominal wall hernia or subcutaneous lesions. Musculoskeletal: No significant bony findings. Stable degenerative changes involving the spine and hips. IMPRESSION: 1. Slightly improved but persistent area of fairly marked inflammation involving the upper sigmoid colon/descending colon junction region. No abscess or free air. 2. No other significant abdominal/pelvic findings, mass lesions or adenopathy. 3. Aortic atherosclerosis. Aortic Atherosclerosis (ICD10-I70.0). Electronically Signed   By: Marijo Sanes M.D.   On:  10/21/2020 13:40    Procedures Procedures (including critical care time)  Medications Ordered in ED Medications  morphine 2 MG/ML injection 2 mg (has no administration in time range)  piperacillin-tazobactam (ZOSYN) IVPB 3.375 g (has no administration in time range)  influenza vaccine adjuvanted (FLUAD) injection 0.5 mL (has no administration in time range)  acebutolol (SECTRAL) capsule 200 mg (has no administration in time range)  zolpidem (AMBIEN) tablet 5 mg (has no administration in time range)  levothyroxine (SYNTHROID) tablet 100 mcg (  has no administration in time range)  multivitamin with minerals tablet 1 tablet (has no administration in time range)  heparin injection 5,000 Units (has no administration in time range)  0.9 %  sodium chloride infusion ( Intravenous New Bag/Given 10/21/20 2041)  acetaminophen (TYLENOL) tablet 650 mg (650 mg Oral Given 10/21/20 2059)    Or  acetaminophen (TYLENOL) suppository 650 mg ( Rectal See Alternative 10/21/20 2059)  ondansetron (ZOFRAN) tablet 4 mg (has no administration in time range)    Or  ondansetron (ZOFRAN) injection 4 mg (has no administration in time range)  LORazepam (ATIVAN) tablet 1 mg (has no administration in time range)  piperacillin-tazobactam (ZOSYN) IVPB 3.375 g (0 g Intravenous Stopped 10/21/20 1848)    ED Course  I have reviewed the triage vital signs and the nursing notes.  Pertinent labs & imaging results that were available during my care of the patient were reviewed by me and considered in my medical decision making (see chart for details).  This patient complains of abdominal pain in the setting of persistent diverticulitis seen on outpatient CT imaging.  This involves an extensive number of treatment options, and is a complaint that carries with it a high risk of complications and morbidity.  DDx includes persistent infectious cause vs malignancy vs other  Low suspicion for perforated bowel or bowel obstruction  based on recent CT imaging and physical exam.  Low suspicion for sepsis.  I ordered, reviewed, and interpreted labs, which included CMP and CBC with diff and Lipase - all largely unremarkable, with no emergent findings.  Covid test negative.   I ordered medication IV zosyn for diverticulitis/intraabdominal infection Previous records obtained and reviewed showing outpatient CT imaging reports  After the interventions stated above, I reevaluated the patient and found he remained hemodynamically stable.  Pending admission to hospitalist.   Clinical Course as of Oct 21 2229  Fri Oct 21, 2020  1828 Will admit to hospitalist   [MT]  1846 Signed out to hospitalist   [MT]    Clinical Course User Index [MT] Langston Masker, Carola Rhine, MD    Final Clinical Impression(s) / ED Diagnoses Final diagnoses:  Diverticulitis of colon    Rx / DC Orders ED Discharge Orders    None       Wyvonnia Dusky, MD 10/21/20 2230

## 2020-10-21 NOTE — Progress Notes (Signed)
A consult was received from an ED physician for Zosyn per pharmacy dosing.  The patient's profile has been reviewed for ht/wt/allergies/indication/available labs.   A one time order has been placed for Zosyn 3.375 g x 1.  Further antibiotics/pharmacy consults should be ordered by admitting physician if indicated.                       Thank you, Napoleon Form 10/21/2020  5:07 PM

## 2020-10-21 NOTE — ED Notes (Signed)
Transport called to take pt to floor.  

## 2020-10-22 DIAGNOSIS — K5900 Constipation, unspecified: Secondary | ICD-10-CM

## 2020-10-22 DIAGNOSIS — K5732 Diverticulitis of large intestine without perforation or abscess without bleeding: Principal | ICD-10-CM

## 2020-10-22 DIAGNOSIS — I10 Essential (primary) hypertension: Secondary | ICD-10-CM

## 2020-10-22 DIAGNOSIS — F419 Anxiety disorder, unspecified: Secondary | ICD-10-CM

## 2020-10-22 LAB — COMPREHENSIVE METABOLIC PANEL
ALT: 27 U/L (ref 0–44)
AST: 23 U/L (ref 15–41)
Albumin: 3.4 g/dL — ABNORMAL LOW (ref 3.5–5.0)
Alkaline Phosphatase: 75 U/L (ref 38–126)
Anion gap: 7 (ref 5–15)
BUN: 13 mg/dL (ref 8–23)
CO2: 26 mmol/L (ref 22–32)
Calcium: 9 mg/dL (ref 8.9–10.3)
Chloride: 107 mmol/L (ref 98–111)
Creatinine, Ser: 1.09 mg/dL (ref 0.61–1.24)
GFR, Estimated: >60 mL/min (ref >60)
Glucose, Bld: 99 mg/dL (ref 70–99)
Potassium: 4.1 mmol/L (ref 3.5–5.1)
Sodium: 140 mmol/L (ref 135–145)
Total Bilirubin: 0.9 mg/dL (ref 0.3–1.2)
Total Protein: 6.5 g/dL (ref 6.5–8.1)

## 2020-10-22 LAB — CBC
HCT: 36 % — ABNORMAL LOW (ref 39.0–52.0)
Hemoglobin: 12.1 g/dL — ABNORMAL LOW (ref 13.0–17.0)
MCH: 31.6 pg (ref 26.0–34.0)
MCHC: 33.6 g/dL (ref 30.0–36.0)
MCV: 94 fL (ref 80.0–100.0)
Platelets: 226 10*3/uL (ref 150–400)
RBC: 3.83 MIL/uL — ABNORMAL LOW (ref 4.22–5.81)
RDW: 12.7 % (ref 11.5–15.5)
WBC: 5.3 10*3/uL (ref 4.0–10.5)
nRBC: 0 % (ref 0.0–0.2)

## 2020-10-22 LAB — HIV ANTIBODY (ROUTINE TESTING W REFLEX): HIV Screen 4th Generation wRfx: NONREACTIVE

## 2020-10-22 LAB — MAGNESIUM: Magnesium: 2.1 mg/dL (ref 1.7–2.4)

## 2020-10-22 LAB — TSH: TSH: 3.285 u[IU]/mL (ref 0.350–4.500)

## 2020-10-22 MED ORDER — SENNOSIDES-DOCUSATE SODIUM 8.6-50 MG PO TABS
1.0000 | ORAL_TABLET | Freq: Two times a day (BID) | ORAL | Status: DC
  Administered 2020-10-22 – 2020-10-23 (×2): 1 via ORAL
  Filled 2020-10-22 (×2): qty 1

## 2020-10-22 MED ORDER — HYDRALAZINE HCL 20 MG/ML IJ SOLN: 10.0000 mg | Freq: Four times a day (QID) | INTRAMUSCULAR | Status: DC | PRN

## 2020-10-22 MED ORDER — ALLOPURINOL 300 MG PO TABS
300.0000 mg | ORAL_TABLET | Freq: Every day | ORAL | Status: DC
  Administered 2020-10-22: 300 mg via ORAL
  Filled 2020-10-22 (×2): qty 1

## 2020-10-22 MED ORDER — OXYCODONE HCL 5 MG PO TABS: 5.0000 mg | ORAL_TABLET | Freq: Four times a day (QID) | ORAL | Status: DC | PRN

## 2020-10-22 MED ORDER — ENOXAPARIN SODIUM 40 MG/0.4ML ~~LOC~~ SOLN
40.0000 mg | Freq: Every day | SUBCUTANEOUS | Status: DC
  Administered 2020-10-22 – 2020-10-23 (×2): 40 mg via SUBCUTANEOUS
  Filled 2020-10-22 (×2): qty 0.4

## 2020-10-22 NOTE — Progress Notes (Signed)
PROGRESS NOTE    Michael Melendez  EGB:[id number] DOB: 02/19/1951 DOA: 10/21/2020 PCP: [name], MD    Chief Complaint  Patient presents with  . Diverticulitis    Brief Narrative:  HPI per Dr. Pincus Large  is a 69 y.o. male, with history of hypertension, hyperlipidemia-cannot tolerate statins, diverticulitis, anxiety, and more presents to the ED with a chief complaint of abdominal pain.  The pain is located in his left lower quadrant.  It has been present for 6 weeks.  He describes it as a burning that is intermittent.  He describes it as coming in waves of pain that take him to his knees.  When he has these waves the pain he has the urge to defecate, but cannot produce a bowel movement.  Reports eating aggravates the pain, fasting makes it better.  He does admit to occasional associated fever and chills as well as diaphoresis over the 6 weeks.  The pain usually starts at 3 or 3:30 in the morning, and improves by afternoon.  He is in no pain at the time of my exam.  Patient reports associated constipation for the last 2 months.  He attributes the constipation to a calcium channel blocker that he was on briefly.  His last bowel movement was this morning without straining.  Prior to that a GI friend of his encouraged him to drink a gallon of MiraLAX 1 glass at a time every hour until he had relief, and he reports a large bowel movement at that time earlier this week.  Patient reports that he takes prune juice, Senokot, MiraLAX every night.  Patient reports that he has been following with his general practitioner and [name] for his abdominal pain.  He has had 2 or 3 outpatient CT scans.  Last one was today.  It showed inflammation in the colon.  He has been on several courses of antibiotics including Cipro Flagyl-which was stopped mid course due to rash, then Augmentin, then amoxicillin, and then back on Augmentin.  [name] told him if he did not have any relief with his last course of  Augmentin he would need to go to the hospital for IV antibiotics per his report.  In the ED Temperature 97.8, heart rate 66, respiratory rate 16, blood pressure 133/78, satting at 97% on room air No leukocytosis with a white blood cell count of 7.3, hemoglobin 13.0 Creatinine at baseline at 1.20 Lipase 30 Covid flu pending CT abdomen pelvis -slightly improved but persistent area of fairly marked inflammation involving the upper sigmoid colon descending colon junction region.  No abscess or free air. Zosyn started  Assessment & Plan:   Principal Problem:   Diverticulitis Active Problems:   HTN (hypertension)   Anxiety   Constipation  1 acute recurrent diverticulitis Patient presenting with ongoing lower abdominal pain, diverticulitis which has been refractory to outpatient therapy.  CT abdomen and pelvis done showing colonic inflammation.  Patient noted to have taking some laxatives prior to admission and stated that had good bowel movements from that.  Patient improving clinically on IV antibiotics.  Continue IV Zosyn.  Patient seen in consultation by gastroenterology who have recommended starting patient on full liquid diet and advancing as tolerated to a high-fiber diet.  Avoid NSAIDs per GI.  Supportive care.  Follow.  2.  Hypertension Patient stated calcium channel blocker started in the past has led to constipation.  Currently on beta-blocker which we will continue for now.  Hydralazine as needed.  3.  Anxiety Continue Ativan at night.  Follow.  4.  Hyperlipidemia Patient with an intolerance to statins secondary to myalgias.  Outpatient follow-up with PCP.  5.  Constipation Place on Senokot-S twice daily.  May need to be placed on MiraLAX daily however will defer to GI.   DVT prophylaxis: Heparin >>Lovenox Code Status: Full Family Communication: Updated patient.  No family at bedside. Disposition:   Status is: Inpatient    Dispo: The patient is from: Home               Anticipated d/c is to: Home              Anticipated d/c date is: 1 to 2 days.              Patient currently on IV antibiotics, on a liquid diet.  Not stable for discharge.       Consultants:   Gastroenterology: Dr. Alessandra Bevels 10/22/2020  Procedures:   CT abdomen pelvis 10/21/2020    Antimicrobials:   IV Zosyn 10/21/2020   Subjective: Sitting up in bed.  Stated used laxatives just prior to coming in and had bowel cleansing.  Denies any significant abdominal pain this morning.  No nausea or emesis.  No chest pain.  No shortness of breath.  About to drink his coffee.  Objective: Vitals:   10/22/20 0119 10/22/20 0541 10/22/20 1000 10/22/20 1325  BP: (!) 145/88 123/79 119/63 (!) 152/76  Pulse: 61 (!) 48 62 62  Resp: 20 15 14 18   Temp: 98 F (36.7 C) 98 F (36.7 C) 98.3 F (36.8 C) 98.1 F (36.7 C)  TempSrc: Oral  Oral Oral  SpO2: 99% 93%  99%  Weight:      Height:        Intake/Output Summary (Last 24 hours) at 10/22/2020 1518 Last data filed at 10/22/2020 1328 Gross per 24 hour  Intake 1252.58 ml  Output 1300 ml  Net -47.42 ml   Filed Weights   10/21/20 1616  Weight: 95.3 kg    Examination:  General exam: Appears calm and comfortable  Respiratory system: Clear to auscultation. Respiratory effort normal. Cardiovascular system: S1 & S2 heard, RRR. No JVD, murmurs, rubs, gallops or clicks. No pedal edema. Gastrointestinal system: Abdomen is nondistended, soft and tenderness to deep palpation in the left lower quadrant.  Positive bowel sounds.  No rebound.  No guarding.  Central nervous system: Alert and oriented. No focal neurological deficits. Extremities: Symmetric 5 x 5 power. Skin: No rashes, lesions or ulcers Psychiatry: Judgement and insight appear normal. Mood & affect appropriate.     Data Reviewed: I have personally reviewed following labs and imaging studies  CBC: Recent Labs  Lab 10/21/20 1710 10/22/20 0452  WBC 7.3 5.3  NEUTROABS  5.2  --   HGB 13.0 12.1*  HCT 38.2* 36.0*  MCV 92.9 94.0  PLT 238 409    Basic Metabolic Panel: Recent Labs  Lab 10/21/20 1710 10/22/20 0452  NA 139 140  K 3.8 4.1  CL 103 107  CO2 24 26  GLUCOSE 117* 99  BUN 15 13  CREATININE 1.20 1.09  CALCIUM 9.4 9.0  MG  --  2.1    GFR: Estimated Creatinine Clearance: 70.6 mL/min (by C-G formula based on SCr of 1.09 mg/dL).  Liver Function Tests: Recent Labs  Lab 10/21/20 1710 10/22/20 0452  AST 30 23  ALT 30 27  ALKPHOS 77 75  BILITOT 0.7 0.9  PROT 7.0 6.5  ALBUMIN 3.6 3.4*    CBG: No results for input(s): GLUCAP in the last 168 hours.   Recent Results (from the past 240 hour(s))  Respiratory Panel by RT PCR (Flu A&B, Covid) - Nasopharyngeal Swab     Status: None   Collection Time: 10/21/20  5:10 PM   Specimen: Nasopharyngeal Swab  Result Value Ref Range Status   SARS Coronavirus 2 by RT PCR NEGATIVE NEGATIVE Final    Comment: (NOTE) SARS-CoV-2 target nucleic acids are NOT DETECTED.  The SARS-CoV-2 RNA is generally detectable in upper respiratoy specimens during the acute phase of infection. The lowest concentration of SARS-CoV-2 viral copies this assay can detect is 131 copies/mL. A negative result does not preclude SARS-Cov-2 infection and should not be used as the sole basis for treatment or other patient management decisions. A negative result may occur with  improper specimen collection/handling, submission of specimen other than nasopharyngeal swab, presence of viral mutation(s) within the areas targeted by this assay, and inadequate number of viral copies (<131 copies/mL). A negative result must be combined with clinical observations, patient history, and epidemiological information. The expected result is Negative.  Fact Sheet for Patients:  PinkCheek.be  Fact Sheet for Healthcare Providers:  GravelBags.it  This test is no t yet approved or  cleared by the [place] FDA and  has been authorized for detection and/or diagnosis of SARS-CoV-2 by FDA under an Emergency Use Authorization (EUA). This EUA will remain  in effect (meaning this test can be used) for the duration of the COVID-19 declaration under Section 564(b)(1) of the Act, 21 U.S.C. section 360bbb-3(b)(1), unless the authorization is terminated or revoked sooner.     Influenza A by PCR NEGATIVE NEGATIVE Final   Influenza B by PCR NEGATIVE NEGATIVE Final    Comment: (NOTE) The Xpert Xpress SARS-CoV-2/FLU/RSV assay is intended as an aid in  the diagnosis of influenza from Nasopharyngeal swab specimens and  should not be used as a sole basis for treatment. Nasal washings and  aspirates are unacceptable for Xpert Xpress SARS-CoV-2/FLU/RSV  testing.  Fact Sheet for Patients: PinkCheek.be  Fact Sheet for Healthcare Providers: GravelBags.it  This test is not yet approved or cleared by the [place] FDA and  has been authorized for detection and/or diagnosis of SARS-CoV-2 by  FDA under an Emergency Use Authorization (EUA). This EUA will remain  in effect (meaning this test can be used) for the duration of the  Covid-19 declaration under Section 564(b)(1) of the Act, 21  U.S.C. section 360bbb-3(b)(1), unless the authorization is  terminated or revoked. Performed at [facility], [place] [address] [place], [place] [address]          Radiology Studies: CT ABDOMEN PELVIS W CONTRAST  Result Date: 10/21/2020 CLINICAL DATA:  Re-evaluate colonic diverticulitis. EXAM: CT ABDOMEN AND PELVIS WITH CONTRAST TECHNIQUE: Multidetector CT imaging of the abdomen and pelvis was performed using the standard protocol following bolus administration of intravenous contrast. CONTRAST:  161mL ISOVUE-300 IOPAMIDOL (ISOVUE-300) INJECTION 61% COMPARISON:  CT scan 09/21/2020 FINDINGS: Lower chest: Insert  lung bases Hepatobiliary: No worrisome hepatic lesions or intrahepatic biliary dilatation. Tiny low-attenuation lesion in the left hepatic dome area is likely a benign cyst. The gallbladder is mildly contracted. No common bile duct dilatation. Pancreas: Moderate pancreatic atrophy but no mass, inflammation or ductal dilatation. Spleen: Normal size.  No focal lesions. Adrenals/Urinary Tract: The adrenal glands and kidneys are unremarkable and stable. No worrisome renal lesions or hydronephrosis. The bladder is unremarkable.  Stomach/Bowel: The stomach, duodenum and small bowel are unremarkable. No acute inflammatory changes, mass lesions or obstructive findings. The terminal ileum is normal. The appendix is normal. Persistent area of fairly marked inflammation involving the upper sigmoid colon/descending colon junction region. Although it is slight improved there is still fairly significant inflammation and enhancement. I do not see a discrete abscess or free air. The remainder of the colon is unremarkable and stable. Vascular/Lymphatic: Stable tortuosity and calcification of the thoracic aorta. No aneurysm or dissection. The major venous structures are unremarkable. No mesenteric or retroperitoneal mass or adenopathy. Reproductive: The prostate gland and seminal vesicles are unremarkable. Other: No pelvic mass or adenopathy. No free pelvic fluid collections. No inguinal mass or adenopathy. No abdominal wall hernia or subcutaneous lesions. Musculoskeletal: No significant bony findings. Stable degenerative changes involving the spine and hips. IMPRESSION: 1. Slightly improved but persistent area of fairly marked inflammation involving the upper sigmoid colon/descending colon junction region. No abscess or free air. 2. No other significant abdominal/pelvic findings, mass lesions or adenopathy. 3. Aortic atherosclerosis. Aortic Atherosclerosis (ICD10-I70.0). Electronically Signed   By: Marijo Sanes M.D.   On: 10/21/2020  13:40        Scheduled Meds: . acebutolol  200 mg Oral BID  . enoxaparin (LOVENOX) injection  40 mg Subcutaneous Daily  . eszopiclone  3 mg Oral QHS  . levothyroxine  100 mcg Oral Q0600  . LORazepam  1 mg Oral QHS  . multivitamin with minerals  1 tablet Oral Daily   Continuous Infusions: . sodium chloride 75 mL/hr at 10/22/20 0930  . piperacillin-tazobactam 3.375 g (10/22/20 0913)     LOS: 1 day    Time spent: 35 minutes    Irine Seal, MD [facility]   To contact the attending provider between 7A-7P or the covering provider during after hours 7P-7A, please log into the web site [email or web address] and access using universal [place] password for that web site. If you do not have the password, please call the hospital operator.  10/22/2020, 3:18 PM

## 2020-10-22 NOTE — Progress Notes (Signed)
12 lead preformed. NSR, nonspecific ST and T wave abnormality. Provider notified.

## 2020-10-22 NOTE — Consult Note (Signed)
Referring Provider:  [place] Primary Care Physician:  [name], MD Primary Gastroenterologist:  [facility]  Reason for Consultation: Diverticulitis  HPI: Michael Melendez is a 69 y.o. male with past medical history of hypertension, hyperlipidemia and diverticulitis admitted to the hospital for management of refractory but uncomplicated diverticulitis.  Patient was diagnosed with diverticulitis in May 2021 during his emergency room visit to [facility].  He was treated with ciprofloxacin and Flagyl at that time.  He was seen by primary care physician in September 2021 for left lower quadrant abdominal pain.  CT abdomen pelvis on September 21, 2020 showed acute sigmoid diverticulitis.  He was initially placed on ciprofloxacin and Flagyl had some allergic reaction.  Antibiotics were subsequently changed to Augmentin but he only received amoxicillin.  He was subsequently seen by our PA and was started on Augmentin.  Outpatient CT scan abdomen pelvis with contrast yesterday showed persistent inflammation and he was advised to come to the hospital for IV antibiotics.  He was placed on Zosyn and he is feeling much better now.  Abdominal pain is resolved.  Denies any nausea or vomiting.  Denies diarrhea.  Denies any blood in the stool or black stool.  Overall feeling much better.  Last colonoscopy in August 2015 by Dr. Wynetta Emery showed benign prolapse type polyp in the rectum and sigmoid and descending colon diverticulosis.  Repeat was recommended in 10 years.  Past Medical History:  Diagnosis Date   Carpal tunnel syndrome    on right pt says surgery 12-24-11 never occurred   Depression    Diverticulitis    Gout    History of kidney stones    none in years   Hyperlipidemia    Hypertension     Past Surgical History:  Procedure Laterality Date   ANKLE RECONSTRUCTION Bilateral 1996   COLONOSCOPY WITH PROPOFOL N/A 07/27/2014   Procedure: COLONOSCOPY WITH PROPOFOL;  Surgeon: Garlan Fair, MD;   Location: [facility];  Service: Endoscopy;  Laterality: N/A;   ELBOW BURSA SURGERY Right    SHOULDER ARTHROSCOPY Bilateral 1980   rotator cuff repair    Prior to Admission medications   Medication Sig Start Date End Date Taking? Authorizing Provider  acebutolol (SECTRAL) 200 MG capsule Take 1 capsule (200 mg total) by mouth 2 (two) times daily. 09/01/20  Yes Adrian Prows, MD  acetaminophen (TYLENOL) 500 MG tablet Take 500 mg by mouth every 6 (six) hours as needed for mild pain.   Yes [provider]  allopurinol (ZYLOPRIM) 300 MG tablet Take 300 mg by mouth daily.    Yes [provider]  ALPRAZolam Duanne Moron) 0.5 MG tablet Take 0.5 mg by mouth daily as needed for anxiety.  10/19/20  Yes [provider]  [employer] SENNA PLUS 8.6-50 MG tablet Take 2 tablets by mouth at bedtime as needed for mild constipation.  09/12/20  Yes [provider]  Eszopiclone 3 MG TABS Take 3 mg by mouth at bedtime. 08/30/17  Yes [provider]  ezetimibe (ZETIA) 10 MG tablet Take 1 tablet (10 mg total) by mouth daily after supper. 09/12/20 12/11/20 Yes Adrian Prows, MD  ibuprofen (ADVIL,MOTRIN) 200 MG tablet Take 400 mg by mouth every 6 (six) hours as needed (pain).   Yes [provider]  levothyroxine (SYNTHROID) 100 MCG tablet Take 1 tablet by mouth daily. 11/25/19  Yes [provider]  Multiple Vitamin (MULTIVITAMIN WITH MINERALS) TABS tablet Take 1 tablet by mouth daily.   Yes [provider]  oxyCODONE (OXY IR/ROXICODONE) 5 MG immediate release tablet Take 5 mg by mouth every 6 (six) hours as needed for moderate pain.  10/16/20  Yes [provider]  atorvastatin (LIPITOR) 10 MG tablet Take 1 tablet (10 mg total) by mouth daily. Patient not taking: Reported on 10/21/2020 03/01/20 05/30/20  Adrian Prows, MD  sildenafil (VIAGRA) 100 MG tablet Take 1 tablet (100 mg total) by mouth daily as needed for erectile dysfunction. Patient not taking: Reported on  10/21/2020 12/31/19   Adrian Prows, MD    Scheduled Meds:  acebutolol  200 mg Oral BID   eszopiclone  3 mg Oral QHS   heparin  5,000 Units Subcutaneous Q8H   levothyroxine  100 mcg Oral Q0600   LORazepam  1 mg Oral QHS   multivitamin with minerals  1 tablet Oral Daily   Continuous Infusions:  sodium chloride 75 mL/hr at 10/22/20 0930   piperacillin-tazobactam 3.375 g (10/22/20 0913)   PRN Meds:.acetaminophen **OR** acetaminophen, influenza vaccine adjuvanted, morphine injection, ondansetron **OR** ondansetron (ZOFRAN) IV  Allergies as of 10/21/2020 - Review Complete 10/21/2020  Allergen Reaction Noted   Losartan Swelling 10/21/2020   Other Other (See Comments) 03/20/2018    Family History  Problem Relation Age of Onset   Dementia Mother    AAA (abdominal aortic aneurysm) Father    Dementia Father    AAA (abdominal aortic aneurysm) Sister    Atrial fibrillation Sister    Cervical cancer Sister    Anesthesia problems Neg Hx     Social History   Socioeconomic History   Marital status: Widowed    Spouse name: Not on file   Number of children: 2   Years of education: Not on file   Highest education level: Not on file  Occupational History   Not on file  Tobacco Use   Smoking status: Never Smoker   Smokeless tobacco: Never Used  Vaping Use   Vaping Use: Never used  Substance and Sexual Activity   Alcohol use: Yes    Alcohol/week: 7.0 standard drinks    Types: 7 Glasses of wine per week    Comment: 1 bootle of wine per week   Drug use: No   Sexual activity: Not on file  Other Topics Concern   Not on file  Social History Narrative   Not on file   Social Determinants of Health   Financial Resource Strain:    Difficulty of Paying Living Expenses: Not on file  Food Insecurity:    Worried About [place] in the Last Year: Not on file   Ran Out of Food in the Last Year: Not on file  Transportation Needs:    Lack of  Transportation (Medical): Not on file   Lack of Transportation (Non-Medical): Not on file  Physical Activity:    Days of Exercise per Week: Not on file   Minutes of Exercise per Session: Not on file  Stress:    Feeling of Stress : Not on file  Social Connections:    Frequency of Communication with Friends and Family: Not on file   Frequency of Social Gatherings with Friends and Family: Not on file   Attends Religious Services: Not on file   Active Member of Clubs or Organizations: Not on file   Attends Archivist Meetings: Not on file   Marital Status: Not on file  Intimate Partner Violence:    Fear of Current or Ex-Partner: Not on file   Emotionally  Abused: Not on file   Physically Abused: Not on file   Sexually Abused: Not on file    Review of Systems: Review of Systems  Constitutional: Positive for malaise/fatigue. Negative for chills and fever.  HENT: Negative for hearing loss and tinnitus.   Eyes: Negative for blurred vision and double vision.  Respiratory: Negative for cough and hemoptysis.   Cardiovascular: Positive for palpitations. Negative for chest pain.  Gastrointestinal: Positive for abdominal pain. Negative for blood in stool, heartburn, melena, nausea and vomiting.  Genitourinary: Negative for dysuria and urgency.  Musculoskeletal: Negative for myalgias and neck pain.  Skin: Negative for itching and rash.  Neurological: Negative for seizures and weakness.  Endo/Heme/Allergies: Does not bruise/bleed easily.  Psychiatric/Behavioral: Negative for substance abuse and suicidal ideas.    Physical Exam: Vital signs: Vitals:   10/22/20 0119 10/22/20 0541  BP: (!) 145/88 123/79  Pulse: 61 (!) 48  Resp: 20 15  Temp: 98 F (36.7 C) 98 F (36.7 C)  SpO2: 99% 93%     Physical Exam Vitals and nursing note reviewed.  Constitutional:      General: He is not in acute distress.    Appearance: Normal appearance.  HENT:     Head: Normocephalic  and atraumatic.     Nose: Nose normal.  Eyes:     General: No scleral icterus.    Extraocular Movements: Extraocular movements intact.  Cardiovascular:     Rate and Rhythm: Normal rate and regular rhythm.     Heart sounds: No murmur heard.   Pulmonary:     Effort: Pulmonary effort is normal. No respiratory distress.     Breath sounds: Normal breath sounds.  Abdominal:     General: Bowel sounds are normal. There is no distension.     Palpations: Abdomen is soft.     Tenderness: There is abdominal tenderness. There is no guarding.  Musculoskeletal:     Cervical back: Normal range of motion and neck supple.     Right lower leg: No edema.     Left lower leg: No edema.  Skin:    General: Skin is warm.     Coloration: Skin is not jaundiced.  Neurological:     Mental Status: He is alert and oriented to person, place, and time.  Psychiatric:        Mood and Affect: Mood normal.        Thought Content: Thought content normal.        Judgment: Judgment normal.     GI:  Lab Results: Recent Labs    10/21/20 1710 10/22/20 0452  WBC 7.3 5.3  HGB 13.0 12.1*  HCT 38.2* 36.0*  PLT 238 226   BMET Recent Labs    10/21/20 1710 10/22/20 0452  NA 139 140  K 3.8 4.1  CL 103 107  CO2 24 26  GLUCOSE 117* 99  BUN 15 13  CREATININE 1.20 1.09  CALCIUM 9.4 9.0   LFT Recent Labs    10/22/20 0452  PROT 6.5  ALBUMIN 3.4*  AST 23  ALT 27  ALKPHOS 75  BILITOT 0.9   PT/INR No results for input(s): LABPROT, INR in the last 72 hours.   Studies/Results: CT ABDOMEN PELVIS W CONTRAST  Result Date: 10/21/2020 CLINICAL DATA:  Re-evaluate colonic diverticulitis. EXAM: CT ABDOMEN AND PELVIS WITH CONTRAST TECHNIQUE: Multidetector CT imaging of the abdomen and pelvis was performed using the standard protocol following bolus administration of intravenous contrast. CONTRAST:  158mL ISOVUE-300 IOPAMIDOL (ISOVUE-300)  INJECTION 61% COMPARISON:  CT scan 09/21/2020 FINDINGS: Lower chest:  Insert lung bases Hepatobiliary: No worrisome hepatic lesions or intrahepatic biliary dilatation. Tiny low-attenuation lesion in the left hepatic dome area is likely a benign cyst. The gallbladder is mildly contracted. No common bile duct dilatation. Pancreas: Moderate pancreatic atrophy but no mass, inflammation or ductal dilatation. Spleen: Normal size.  No focal lesions. Adrenals/Urinary Tract: The adrenal glands and kidneys are unremarkable and stable. No worrisome renal lesions or hydronephrosis. The bladder is unremarkable. Stomach/Bowel: The stomach, duodenum and small bowel are unremarkable. No acute inflammatory changes, mass lesions or obstructive findings. The terminal ileum is normal. The appendix is normal. Persistent area of fairly marked inflammation involving the upper sigmoid colon/descending colon junction region. Although it is slight improved there is still fairly significant inflammation and enhancement. I do not see a discrete abscess or free air. The remainder of the colon is unremarkable and stable. Vascular/Lymphatic: Stable tortuosity and calcification of the thoracic aorta. No aneurysm or dissection. The major venous structures are unremarkable. No mesenteric or retroperitoneal mass or adenopathy. Reproductive: The prostate gland and seminal vesicles are unremarkable. Other: No pelvic mass or adenopathy. No free pelvic fluid collections. No inguinal mass or adenopathy. No abdominal wall hernia or subcutaneous lesions. Musculoskeletal: No significant bony findings. Stable degenerative changes involving the spine and hips. IMPRESSION: 1. Slightly improved but persistent area of fairly marked inflammation involving the upper sigmoid colon/descending colon junction region. No abscess or free air. 2. No other significant abdominal/pelvic findings, mass lesions or adenopathy. 3. Aortic atherosclerosis. Aortic Atherosclerosis (ICD10-I70.0). Electronically Signed   By: Marijo Sanes M.D.   On:  10/21/2020 13:40    Impression/Plan: -Acute diverticulitis.  Uncomplicated.  Refractory to outpatient treatment.  Feeling much better on IV antibiotics. -History of recurrent diverticulitis.  Had another episode in May 2021 as well.  Recommendations -------------------------- -Continue IV Zosyn while in hospital -Start full liquid diet and advance as tolerated. -Recommend high-fiber diet.  Avoid NSAIDs -Hopefully discharge home tomorrow on Augmentin for 14 days. -GI will follow.  Management options discussed with the patient.  He verbalized understanding.  Discussed with hospitalist as well.    LOS: 1 day   Otis Brace  MD, FACP 10/22/2020, 10:20 AM  Contact #  [phone]

## 2020-10-23 LAB — CBC WITH DIFFERENTIAL/PLATELET
Abs Immature Granulocytes: 0 10*3/uL (ref 0.00–0.07)
Basophils Absolute: 0 10*3/uL (ref 0.0–0.1)
Basophils Relative: 1 %
Eosinophils Absolute: 0.2 10*3/uL (ref 0.0–0.5)
Eosinophils Relative: 5 %
HCT: 34.5 % — ABNORMAL LOW (ref 39.0–52.0)
Hemoglobin: 11.6 g/dL — ABNORMAL LOW (ref 13.0–17.0)
Immature Granulocytes: 0 %
Lymphocytes Relative: 35 %
Lymphs Abs: 1.3 10*3/uL (ref 0.7–4.0)
MCH: 31.6 pg (ref 26.0–34.0)
MCHC: 33.6 g/dL (ref 30.0–36.0)
MCV: 94 fL (ref 80.0–100.0)
Monocytes Absolute: 0.4 10*3/uL (ref 0.1–1.0)
Monocytes Relative: 11 %
Neutro Abs: 1.8 10*3/uL (ref 1.7–7.7)
Neutrophils Relative %: 48 %
Platelets: 205 10*3/uL (ref 150–400)
RBC: 3.67 MIL/uL — ABNORMAL LOW (ref 4.22–5.81)
RDW: 12.6 % (ref 11.5–15.5)
WBC: 3.8 10*3/uL — ABNORMAL LOW (ref 4.0–10.5)
nRBC: 0 % (ref 0.0–0.2)

## 2020-10-23 LAB — BASIC METABOLIC PANEL
Anion gap: 8 (ref 5–15)
BUN: 9 mg/dL (ref 8–23)
CO2: 24 mmol/L (ref 22–32)
Calcium: 9 mg/dL (ref 8.9–10.3)
Chloride: 107 mmol/L (ref 98–111)
Creatinine, Ser: 1.1 mg/dL (ref 0.61–1.24)
GFR, Estimated: >60 mL/min (ref >60)
Glucose, Bld: 96 mg/dL (ref 70–99)
Potassium: 3.8 mmol/L (ref 3.5–5.1)
Sodium: 139 mmol/L (ref 135–145)

## 2020-10-23 LAB — MAGNESIUM: Magnesium: 2 mg/dL (ref 1.7–2.4)

## 2020-10-23 MED ORDER — AMOXICILLIN-POT CLAVULANATE 875-125 MG PO TABS: 1.0000 | ORAL_TABLET | Freq: Two times a day (BID) | ORAL | 0 refills | Status: AC

## 2020-10-23 NOTE — Progress Notes (Signed)
Assessment unchanged. Pt verbalized understanding of dc instructions. Discharged via foot per request accompanied by NT to go home via private vehicle.

## 2020-10-23 NOTE — Progress Notes (Signed)
[facility] Gastroenterology Progress Note  Michael Melendez 69 y.o. [birth year]  CC: Diverticulitis   Subjective: Patient seen and examined at bedside.  Feeling much better today.  Had a regular bowel movement.  Minimal left lower quadrant discomfort.  ROS : Afebrile.  Negative for nausea and vomiting.   Objective: Vital signs in last 24 hours: Vitals:   10/23/20 0542 10/23/20 1101  BP: 134/78 130/85  Pulse: (!) 46 (!) 57  Resp: 17 18  Temp: 98.1 F (36.7 C) 98.1 F (36.7 C)  SpO2: 96% 98%    Physical Exam:  General:  Alert, cooperative, no distress, appears stated age  Head:  Normocephalic, without obvious abnormality, atraumatic  Eyes:  , EOM's intact,   Lungs:   Clear to auscultation bilaterally, respirations unlabored  Heart:  Regular rate and rhythm, S1, S2 normal  Abdomen:   Soft, non-tender, minimal left lower quadrant discomfort on palpation, bowel sounds present.  No peritoneal signs  Extremities: Extremities normal, atraumatic, no  edema  Pulses: 2+ and symmetric    Lab Results: Recent Labs    10/22/20 0452 10/23/20 0539  NA 140 139  K 4.1 3.8  CL 107 107  CO2 26 24  GLUCOSE 99 96  BUN 13 9  CREATININE 1.09 1.10  CALCIUM 9.0 9.0  MG 2.1 2.0   Recent Labs    10/21/20 1710 10/22/20 0452  AST 30 23  ALT 30 27  ALKPHOS 77 75  BILITOT 0.7 0.9  PROT 7.0 6.5  ALBUMIN 3.6 3.4*   Recent Labs    10/21/20 1710 10/21/20 1710 10/22/20 0452 10/23/20 0539  WBC 7.3   < > 5.3 3.8*  NEUTROABS 5.2  --   --  1.8  HGB 13.0   < > 12.1* 11.6*  HCT 38.2*   < > 36.0* 34.5*  MCV 92.9   < > 94.0 94.0  PLT 238   < > 226 205   < > = values in this interval not displayed.   No results for input(s): LABPROT, INR in the last 72 hours.    Assessment/Plan: -Acute diverticulitis.  Uncomplicated.  Refractory to outpatient treatment.  Feeling much better on IV antibiotics. -History of recurrent diverticulitis.  Had another episode in May 2021 as  well.  Recommendations -------------------------- -Advance diet to soft. -Okay to discharge home from GI standpoint.  Recommend Augmentin for 14 days.  Discussed with hospitalist. -Patient was advised to stay on soft diet for few days and slowly advance diet as tolerated.  High-fiber diet also recommended.  Avoid NSAIDs. -GI will sign off.  Call us back if needed.  Follow-up in GI clinic in 4 weeks.   Otis Brace MD, [place] 10/23/2020, 11:23 AM  Contact #  [phone]

## 2020-10-23 NOTE — Discharge Summary (Signed)
Physician Discharge Summary  Michael Melendez CHE:[id number] DOB: 09/23/1951 DOA: 10/21/2020  PCP: Michael Carol, MD  Admit date: 10/21/2020 Discharge date: 10/23/2020  Time spent: 50 minutes  Recommendations for Outpatient Follow-up:  1. Follow-up with [name] gastroenterology in 4 weeks.  Office will call with an appointment time. 2. Follow-up with Michael Carol, MD as scheduled.   Discharge Diagnoses:  Principal Problem:   Diverticulitis Active Problems:   HTN (hypertension)   Anxiety   Constipation   Diverticulitis of colon   Discharge Condition: Stable and improved  Diet recommendation: Soft diet and slowly advance to high-fiber diet.  Filed Weights   10/21/20 1616  Weight: 95.3 kg    History of present illness:  HPI per Dr Michael Melendez  is a 69 y.o. male, with history of hypertension, hyperlipidemia-cannot tolerate statins, diverticulitis, anxiety, and more presents to the ED with a chief complaint of abdominal pain.  The pain is located in his left lower quadrant.  It has been present for 6 weeks.  He describes it as a burning that is intermittent.  He describes it as coming in waves of pain that take him to his knees.  When he has these waves the pain he has the urge to defecate, but cannot produce a bowel movement.  Reports eating aggravates the pain, fasting makes it better.  He does admit to occasional associated fever and chills as well as diaphoresis over the 6 weeks.  The pain usually starts at 3 or 3:30 in the morning, and improves by afternoon.  He is in no pain at the time of my exam.  Patient reports associated constipation for the last 2 months.  He attributes the constipation to a calcium channel blocker that he was on briefly.  His last bowel movement was this morning without straining.  Prior to that a GI friend of his encouraged him to drink a gallon of MiraLAX 1 glass at a time every hour until he had relief, and he reports a Melendez bowel movement at  that time earlier this week.  Patient reports that he takes prune juice, Senokot, MiraLAX every night.  Patient reports that he has been following with his general practitioner and [name] for his abdominal pain.  He has had 2 or 3 outpatient CT scans.  Last one was today.  It showed inflammation in the colon.  He has been on several courses of antibiotics including Cipro Flagyl-which was stopped mid course due to rash, then Augmentin, then amoxicillin, and then back on Augmentin.  [name] told him if he did not have any relief with his last course of Augmentin he would need to go to the hospital for IV antibiotics per his report.  In the ED Temperature 97.8, heart rate 66, respiratory rate 16, blood pressure 133/78, satting at 97% on room air No leukocytosis with a white blood cell count of 7.3, hemoglobin 13.0 Creatinine at baseline at 1.20 Lipase 30 Covid flu pending CT abdomen pelvis -slightly improved but persistent area of fairly marked inflammation involving the upper sigmoid colon descending colon junction region.  No abscess or free air. Zosyn started    Hospital Course:  1 acute recurrent diverticulitis Patient presented with ongoing lower abdominal pain, diverticulitis which had been refractory to outpatient therapy.  CT abdomen and pelvis done showed colonic inflammation.  Patient noted to have taken some laxatives prior to admission and stated that had good bowel movements from that.  Patient was placed empirically on IV  Zosyn, initially kept n.p.o.  Gastroenterology was consulted.  Patient improved clinically and was placed on a full liquid diet which he tolerated.  Abdominal pain improved.  Patient was having bowel movements.  GI recommended to avoid NSAIDs and advancing diet to a soft diet.  GI cleared patient for discharge home on a soft diet with slow advancement to a high-fiber diet.  Patient also instructed to avoid NSAIDs.  Outpatient follow-up with GI in 4 weeks.  Patient  will be discharged in stable and improved condition.  2.  Hypertension Patient stated calcium channel blocker started in the past led to constipation.    Patient maintained on home regimen beta-blocker which was continued on.  Outpatient follow-up with PCP.    3.  Anxiety Patient maintained on Ativan nightly.  Outpatient follow-up.  4.  Hyperlipidemia Patient with an intolerance to statins secondary to myalgias.  Outpatient follow-up with PCP.  5.  Constipation Patient placed Senokot-S twice daily.  Patient was followed by GI during the hospitalization.  Patient had bowel movements.  Outpatient follow-up.   Procedures:  CT abdomen pelvis 10/21/2020  Consultations:  Gastroenterology: Dr. Alessandra Bevels 10/22/2020   Discharge Exam: Vitals:   10/23/20 0542 10/23/20 1101  BP: 134/78 130/85  Pulse: (!) 46 (!) 57  Resp: 17 18  Temp: 98.1 F (36.7 C) 98.1 F (36.7 C)  SpO2: 96% 98%    General: NAD Cardiovascular: RRR Respiratory: CTAB GI: Abdomen soft, nondistended, positive bowel sounds, some tenderness to deep palpation in the left lower quadrant.  Discharge Instructions   Discharge Instructions    Diet - low sodium heart healthy   Complete by: As directed    SOFT DIET   Increase activity slowly   Complete by: As directed      Allergies as of 10/23/2020      Reactions   Losartan Swelling   Other Other (See Comments)   Stuffy nose & eye watering.  PET DANDER      Medication List    STOP taking these medications   atorvastatin 10 MG tablet Commonly known as: LIPITOR   ibuprofen 200 MG tablet Commonly known as: ADVIL     TAKE these medications   acebutolol 200 MG capsule Commonly known as: SECTRAL Take 1 capsule (200 mg total) by mouth 2 (two) times daily.   acetaminophen 500 MG tablet Commonly known as: TYLENOL Take 500 mg by mouth every 6 (six) hours as needed for mild pain.   allopurinol 300 MG tablet Commonly known as: ZYLOPRIM Take 300  mg by mouth daily.   ALPRAZolam 0.5 MG tablet Commonly known as: XANAX Take 0.5 mg by mouth daily as needed for anxiety.   amoxicillin-clavulanate 875-125 MG tablet Commonly known as: Augmentin Take 1 tablet by mouth 2 (two) times daily for 14 days.   [employer] Senna Plus 8.6-50 MG tablet Generic drug: senna-docusate Take 2 tablets by mouth at bedtime as needed for mild constipation.   Eszopiclone 3 MG Tabs Take 3 mg by mouth at bedtime.   ezetimibe 10 MG tablet Commonly known as: ZETIA Take 1 tablet (10 mg total) by mouth daily after supper.   levothyroxine 100 MCG tablet Commonly known as: SYNTHROID Take 1 tablet by mouth daily.   multivitamin with minerals Tabs tablet Take 1 tablet by mouth daily.   oxyCODONE 5 MG immediate release tablet Commonly known as: Oxy IR/ROXICODONE Take 5 mg by mouth every 6 (six) hours as needed for moderate pain.   sildenafil 100 MG tablet  Commonly known as: Viagra Take 1 tablet (100 mg total) by mouth daily as needed for erectile dysfunction.      Allergies  Allergen Reactions  . Losartan Swelling  . Other Other (See Comments)    Stuffy nose & eye watering.  PET DANDER    Follow-up Information    Gastroenterology, [facility]. Schedule an appointment as soon as possible for a visit in 4 week(s).   Why: Follow-up on diverticulitis Contact information: [place] [address] [phone]        Michael Carol, MD Follow up.   Specialty: Internal Medicine Why: F/U as scheduled. Contact information: [address] [place] [address] [phone]                The results of significant diagnostics from this hospitalization (including imaging, microbiology, ancillary and laboratory) are listed below for reference.    Significant Diagnostic Studies: CT ABDOMEN PELVIS W CONTRAST  Result Date: 10/21/2020 CLINICAL DATA:  Re-evaluate colonic diverticulitis. EXAM: CT ABDOMEN AND PELVIS  WITH CONTRAST TECHNIQUE: Multidetector CT imaging of the abdomen and pelvis was performed using the standard protocol following bolus administration of intravenous contrast. CONTRAST:  161mL ISOVUE-300 IOPAMIDOL (ISOVUE-300) INJECTION 61% COMPARISON:  CT scan 09/21/2020 FINDINGS: Lower chest: Insert lung bases Hepatobiliary: No worrisome hepatic lesions or intrahepatic biliary dilatation. Tiny low-attenuation lesion in the left hepatic dome area is likely a benign cyst. The gallbladder is mildly contracted. No common bile duct dilatation. Pancreas: Moderate pancreatic atrophy but no mass, inflammation or ductal dilatation. Spleen: Normal size.  No focal lesions. Adrenals/Urinary Tract: The adrenal glands and kidneys are unremarkable and stable. No worrisome renal lesions or hydronephrosis. The bladder is unremarkable. Stomach/Bowel: The stomach, duodenum and small bowel are unremarkable. No acute inflammatory changes, mass lesions or obstructive findings. The terminal ileum is normal. The appendix is normal. Persistent area of fairly marked inflammation involving the upper sigmoid colon/descending colon junction region. Although it is slight improved there is still fairly significant inflammation and enhancement. I do not see a discrete abscess or free air. The remainder of the colon is unremarkable and stable. Vascular/Lymphatic: Stable tortuosity and calcification of the thoracic aorta. No aneurysm or dissection. The major venous structures are unremarkable. No mesenteric or retroperitoneal mass or adenopathy. Reproductive: The prostate gland and seminal vesicles are unremarkable. Other: No pelvic mass or adenopathy. No free pelvic fluid collections. No inguinal mass or adenopathy. No abdominal wall hernia or subcutaneous lesions. Musculoskeletal: No significant bony findings. Stable degenerative changes involving the spine and hips. IMPRESSION: 1. Slightly improved but persistent area of fairly marked  inflammation involving the upper sigmoid colon/descending colon junction region. No abscess or free air. 2. No other significant abdominal/pelvic findings, mass lesions or adenopathy. 3. Aortic atherosclerosis. Aortic Atherosclerosis (ICD10-I70.0). Electronically Signed   By: Marijo Sanes M.D.   On: 10/21/2020 13:40   DG Abd 2 Views  Result Date: 10/14/2020 CLINICAL DATA:  Lower abdominal pain EXAM: ABDOMEN - 2 VIEW COMPARISON:  01/25/2017 FINDINGS: Melendez stool burden throughout the colon. Calcified phleboliths in the lower pelvis, stable. No organomegaly, obstruction, free air, or suspicious calcification. IMPRESSION: Melendez stool burden.  No acute findings. Electronically Signed   By: Rolm Baptise M.D.   On: 10/14/2020 20:28    Microbiology: Recent Results (from the past 240 hour(s))  Respiratory Panel by RT PCR (Flu A&B, Covid) - Nasopharyngeal Swab     Status: None   Collection Time: 10/21/20  5:10  PM   Specimen: Nasopharyngeal Swab  Result Value Ref Range Status   SARS Coronavirus 2 by RT PCR NEGATIVE NEGATIVE Final    Comment: (NOTE) SARS-CoV-2 target nucleic acids are NOT DETECTED.  The SARS-CoV-2 RNA is generally detectable in upper respiratoy specimens during the acute phase of infection. The lowest concentration of SARS-CoV-2 viral copies this assay can detect is 131 copies/mL. A negative result does not preclude SARS-Cov-2 infection and should not be used as the sole basis for treatment or other patient management decisions. A negative result may occur with  improper specimen collection/handling, submission of specimen other than nasopharyngeal swab, presence of viral mutation(s) within the areas targeted by this assay, and inadequate number of viral copies (<131 copies/mL). A negative result must be combined with clinical observations, patient history, and epidemiological information. The expected result is Negative.  Fact Sheet for Patients:   PinkCheek.be  Fact Sheet for Healthcare Providers:  GravelBags.it  This test is no t yet approved or cleared by the [place] FDA and  has been authorized for detection and/or diagnosis of SARS-CoV-2 by FDA under an Emergency Use Authorization (EUA). This EUA will remain  in effect (meaning this test can be used) for the duration of the COVID-19 declaration under Section 564(b)(1) of the Act, 21 U.S.C. section 360bbb-3(b)(1), unless the authorization is terminated or revoked sooner.     Influenza A by PCR NEGATIVE NEGATIVE Final   Influenza B by PCR NEGATIVE NEGATIVE Final    Comment: (NOTE) The Xpert Xpress SARS-CoV-2/FLU/RSV assay is intended as an aid in  the diagnosis of influenza from Nasopharyngeal swab specimens and  should not be used as a sole basis for treatment. Nasal washings and  aspirates are unacceptable for Xpert Xpress SARS-CoV-2/FLU/RSV  testing.  Fact Sheet for Patients: PinkCheek.be  Fact Sheet for Healthcare Providers: GravelBags.it  This test is not yet approved or cleared by the [place] FDA and  has been authorized for detection and/or diagnosis of SARS-CoV-2 by  FDA under an Emergency Use Authorization (EUA). This EUA will remain  in effect (meaning this test can be used) for the duration of the  Covid-19 declaration under Section 564(b)(1) of the Act, 21  U.S.C. section 360bbb-3(b)(1), unless the authorization is  terminated or revoked. Performed at [facility], [place] [address], [place], [place] [address]      Labs: Basic Metabolic Panel: Recent Labs  Lab 10/21/20 1710 10/22/20 0452 10/23/20 0539  NA 139 140 139  K 3.8 4.1 3.8  CL 103 107 107  CO2 24 26 24   GLUCOSE 117* 99 96  BUN 15 13 9   CREATININE 1.20 1.09 1.10  CALCIUM 9.4 9.0 9.0  MG  --  2.1 2.0   Liver Function Tests: Recent Labs   Lab 10/21/20 1710 10/22/20 0452  AST 30 23  ALT 30 27  ALKPHOS 77 75  BILITOT 0.7 0.9  PROT 7.0 6.5  ALBUMIN 3.6 3.4*   Recent Labs  Lab 10/21/20 1710  LIPASE 30   No results for input(s): AMMONIA in the last 168 hours. CBC: Recent Labs  Lab 10/21/20 1710 10/22/20 0452 10/23/20 0539  WBC 7.3 5.3 3.8*  NEUTROABS 5.2  --  1.8  HGB 13.0 12.1* 11.6*  HCT 38.2* 36.0* 34.5*  MCV 92.9 94.0 94.0  PLT 238 226 205   Cardiac Enzymes: No results for input(s): CKTOTAL, CKMB, CKMBINDEX, TROPONINI in the last 168 hours. BNP: BNP (last 3 results) No results for input(s): BNP  in the last 8760 hours.  ProBNP (last 3 results) No results for input(s): PROBNP in the last 8760 hours.  CBG: No results for input(s): GLUCAP in the last 168 hours.     Signed:  Irine Seal MD.  [facility] 10/23/2020, 1:04 PM

## 2020-11-10 DIAGNOSIS — Z20822 Contact with and (suspected) exposure to covid-19: Secondary | ICD-10-CM | POA: Diagnosis not present

## 2020-11-24 ENCOUNTER — Other Ambulatory Visit: Payer: Self-pay | Admitting: Cardiology

## 2020-11-24 DIAGNOSIS — I10 Essential (primary) hypertension: Secondary | ICD-10-CM

## 2020-11-24 DIAGNOSIS — Z23 Encounter for immunization: Secondary | ICD-10-CM | POA: Diagnosis not present

## 2020-12-21 ENCOUNTER — Other Ambulatory Visit: Payer: Self-pay

## 2020-12-21 DIAGNOSIS — I10 Essential (primary) hypertension: Secondary | ICD-10-CM

## 2020-12-21 MED ORDER — ACEBUTOLOL HCL 200 MG PO CAPS: 200.0000 mg | ORAL_CAPSULE | Freq: Two times a day (BID) | ORAL | 1 refills | Status: DC

## 2020-12-26 DIAGNOSIS — Z20822 Contact with and (suspected) exposure to covid-19: Secondary | ICD-10-CM | POA: Diagnosis not present

## 2021-03-16 ENCOUNTER — Ambulatory Visit: Payer: Medicare Other | Admitting: Student

## 2021-03-17 IMAGING — CT CT ABD-PELV W/ CM
2 of 5 series · 14 of 46 positions shown, 16 images · IV contrast (iopamidol)
Comparison: CT scan 09/21/2020

CLINICAL DATA: Re-evaluate colonic diverticulitis.

EXAM:
CT ABDOMEN AND PELVIS WITH CONTRAST
TECHNIQUE: Multidetector CT imaging of the abdomen and pelvis was performed
using the standard protocol following bolus administration of
intravenous contrast.
CONTRAST:  100mL 447P25-5DD IOPAMIDOL (447P25-5DD) INJECTION 61%

[Series 2: abd pelvis 5.00 br40 s3 axial · axial · 0.76mm/px · z∈[+1225,+1635]mm · 11 of 94 slices shown, 13 images]
[im 6/94  soft-tissue]
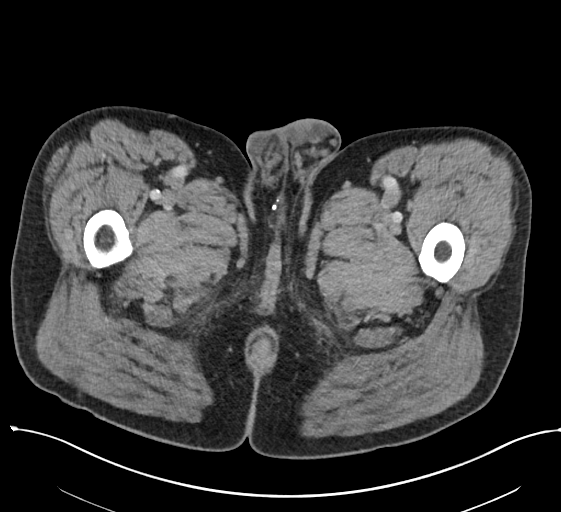
[im 6/94  bone]
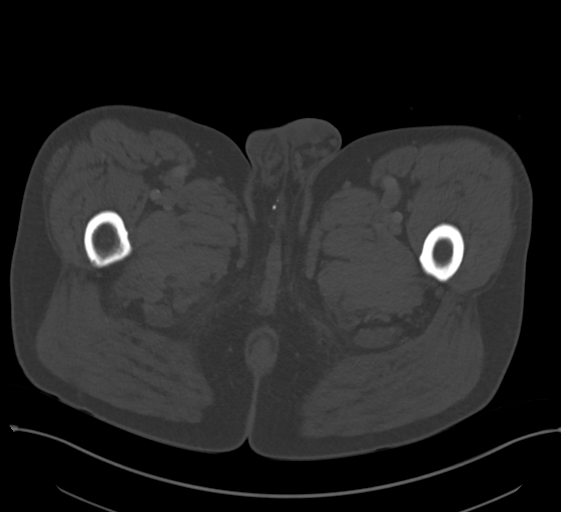
[im 16/94  soft-tissue]
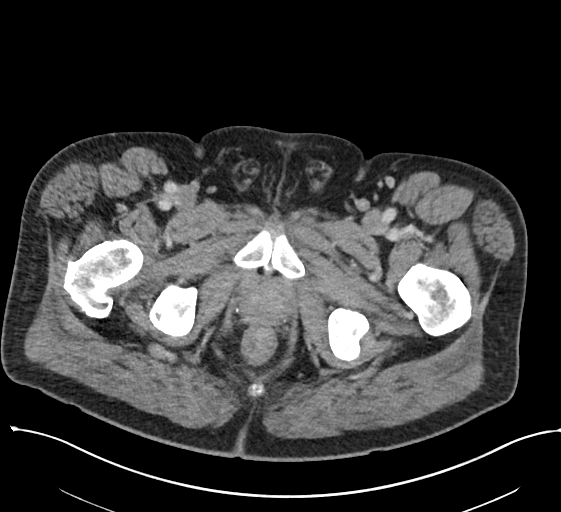
[im 21/94  soft-tissue]
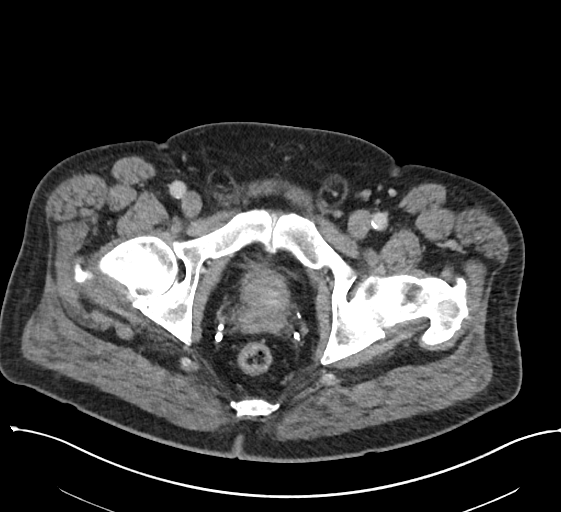
[im 32/94  soft-tissue]
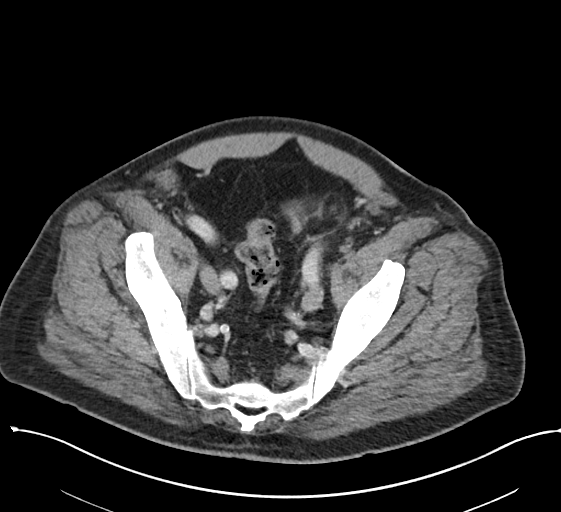
[im 37/94  soft-tissue]
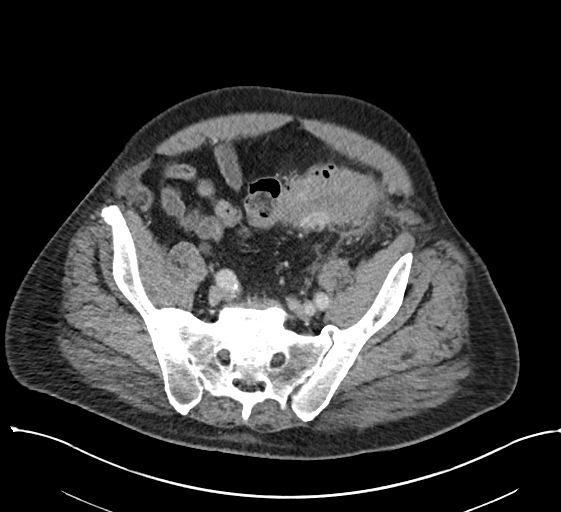
[im 47/94  soft-tissue]
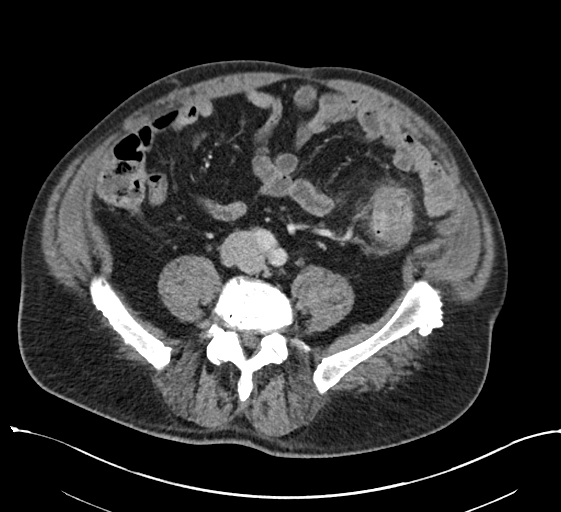
[im 57/94  soft-tissue]
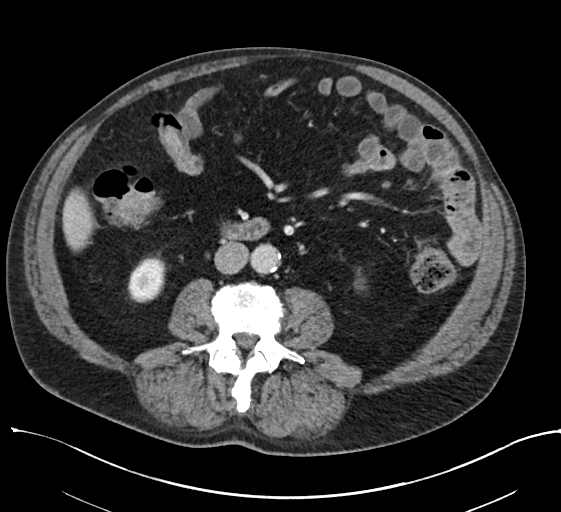
[im 63/94  soft-tissue]
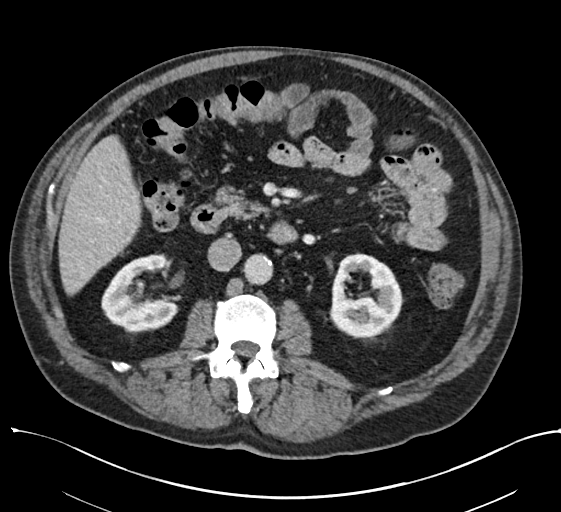
[im 73/94  soft-tissue]
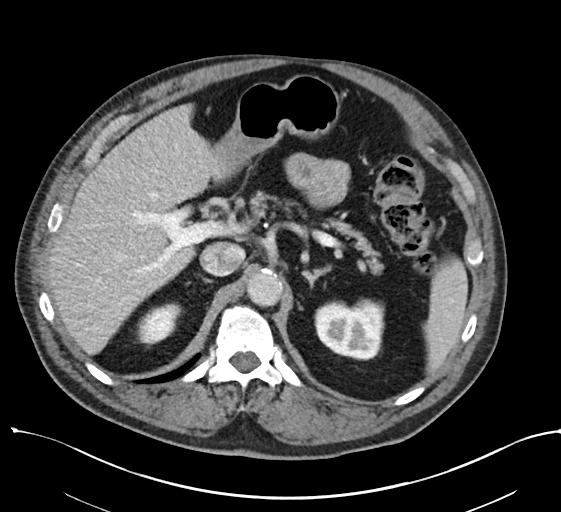
[im 73/94  bone]
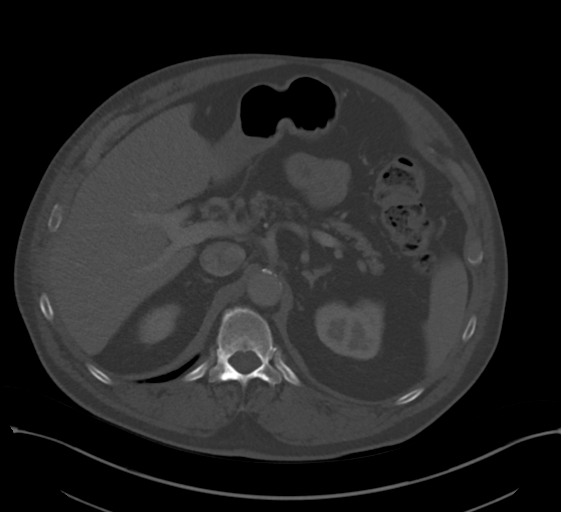
[im 78/94  soft-tissue]
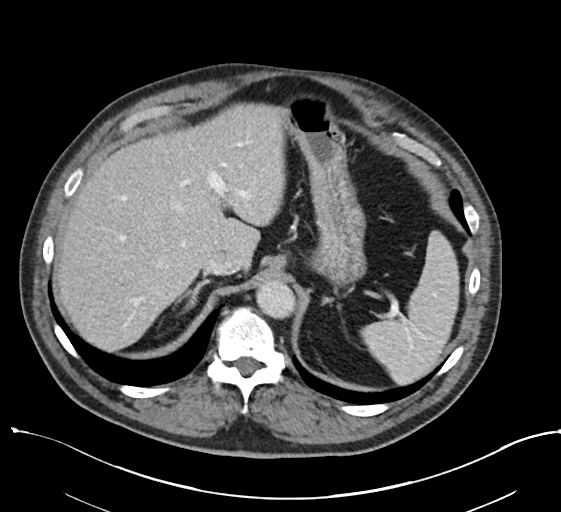
[im 88/94  soft-tissue]
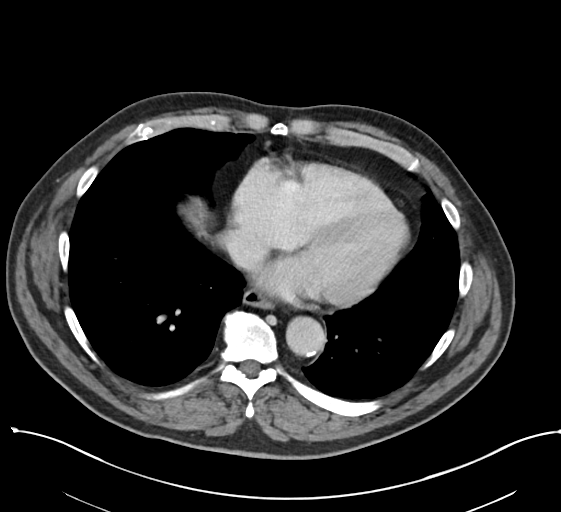

[Series 6: abd pelvis 2.00 br40 s3 cor · coronal · 0.81mm/px · 3 of 159 slices shown]
[im 53/159  soft-tissue]
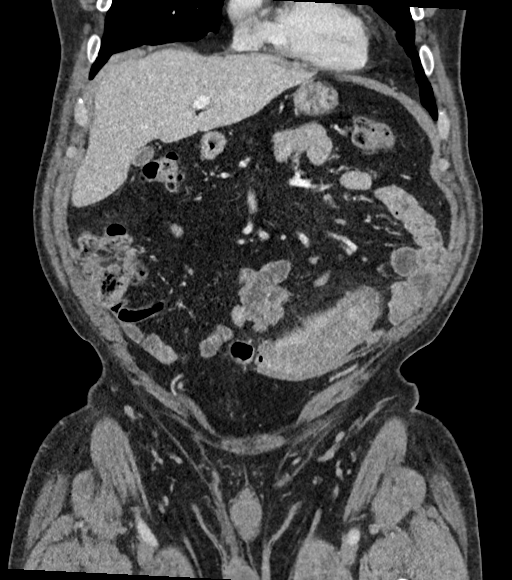
[im 71/159  soft-tissue]
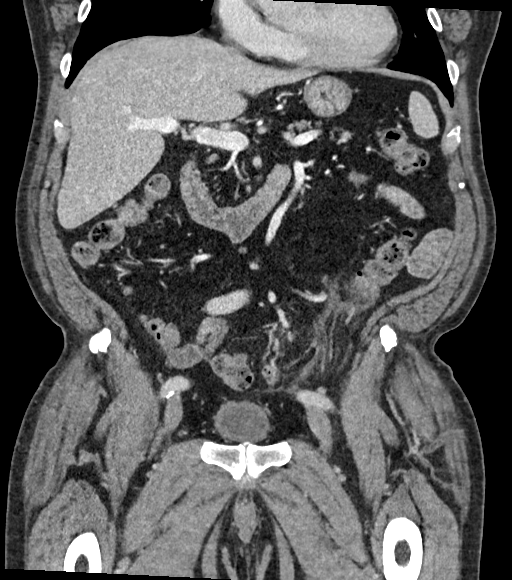
[im 88/159  soft-tissue]
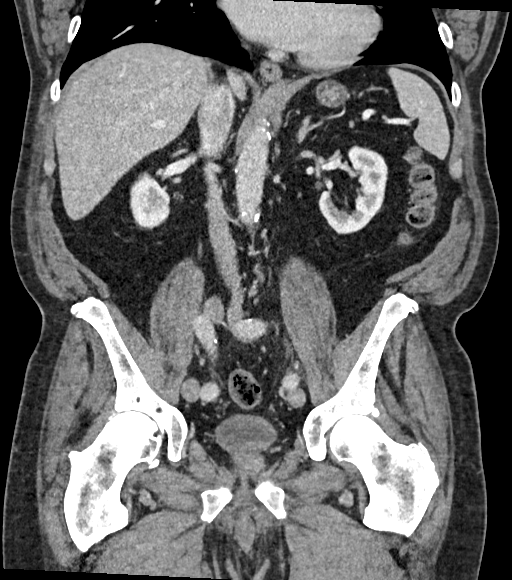

[14 of 46 positions shown; findings below may reference images not displayed]

FINDINGS: Lower chest: Insert lung bases

Hepatobiliary: No worrisome hepatic lesions or intrahepatic biliary
dilatation. Tiny low-attenuation lesion in the left hepatic dome
area is likely a benign cyst. The gallbladder is mildly contracted.
No common bile duct dilatation.

Pancreas: Moderate pancreatic atrophy but no mass, inflammation or
ductal dilatation.

Spleen: Normal size.  No focal lesions.

Adrenals/Urinary Tract: The adrenal glands and kidneys are
unremarkable and stable. No worrisome renal lesions or
hydronephrosis. The bladder is unremarkable.

Stomach/Bowel: The stomach, duodenum and small bowel are
unremarkable. No acute inflammatory changes, mass lesions or
obstructive findings. The terminal ileum is normal. The appendix is
normal.

Persistent area of fairly marked inflammation involving the upper
sigmoid colon/descending colon junction region. Although it is
slight improved there is still fairly significant inflammation and
enhancement. I do not see a discrete abscess or free air. The
remainder of the colon is unremarkable and stable.

Vascular/Lymphatic: Stable tortuosity and calcification of the
thoracic aorta. No aneurysm or dissection. The major venous
structures are unremarkable. No mesenteric or retroperitoneal mass
or adenopathy.

Reproductive: The prostate gland and seminal vesicles are
unremarkable.

Other: No pelvic mass or adenopathy. No free pelvic fluid
collections. No inguinal mass or adenopathy. No abdominal wall
hernia or subcutaneous lesions.

Musculoskeletal: No significant bony findings. Stable degenerative
changes involving the spine and hips.
IMPRESSION: 1. Slightly improved but persistent area of fairly marked
inflammation involving the upper sigmoid colon/descending colon
junction region. No abscess or free air.
2. No other significant abdominal/pelvic findings, mass lesions or
adenopathy.
3. Aortic atherosclerosis.

Aortic Atherosclerosis ([id number]-HF0.0).

## 2021-03-28 DIAGNOSIS — E291 Testicular hypofunction: Secondary | ICD-10-CM | POA: Diagnosis not present

## 2021-04-03 DIAGNOSIS — R7989 Other specified abnormal findings of blood chemistry: Secondary | ICD-10-CM | POA: Diagnosis not present

## 2021-04-03 DIAGNOSIS — N529 Male erectile dysfunction, unspecified: Secondary | ICD-10-CM | POA: Diagnosis not present

## 2021-04-03 DIAGNOSIS — E349 Endocrine disorder, unspecified: Secondary | ICD-10-CM | POA: Diagnosis not present

## 2021-04-06 DIAGNOSIS — Z20822 Contact with and (suspected) exposure to covid-19: Secondary | ICD-10-CM | POA: Diagnosis not present

## 2021-04-19 DIAGNOSIS — J069 Acute upper respiratory infection, unspecified: Secondary | ICD-10-CM | POA: Diagnosis not present

## 2021-05-04 DIAGNOSIS — E291 Testicular hypofunction: Secondary | ICD-10-CM | POA: Diagnosis not present

## 2021-05-11 DIAGNOSIS — R5383 Other fatigue: Secondary | ICD-10-CM | POA: Diagnosis not present

## 2021-05-11 DIAGNOSIS — F5101 Primary insomnia: Secondary | ICD-10-CM | POA: Diagnosis not present

## 2021-05-11 DIAGNOSIS — E291 Testicular hypofunction: Secondary | ICD-10-CM | POA: Diagnosis not present

## 2021-05-30 DIAGNOSIS — E291 Testicular hypofunction: Secondary | ICD-10-CM | POA: Diagnosis not present

## 2021-07-04 DIAGNOSIS — E291 Testicular hypofunction: Secondary | ICD-10-CM | POA: Diagnosis not present

## 2021-07-05 ENCOUNTER — Other Ambulatory Visit: Payer: Self-pay | Admitting: Cardiology

## 2021-07-05 DIAGNOSIS — I10 Essential (primary) hypertension: Secondary | ICD-10-CM

## 2021-08-07 DIAGNOSIS — E291 Testicular hypofunction: Secondary | ICD-10-CM | POA: Diagnosis not present

## 2021-10-16 DIAGNOSIS — E291 Testicular hypofunction: Secondary | ICD-10-CM | POA: Diagnosis not present

## 2021-11-14 DIAGNOSIS — E291 Testicular hypofunction: Secondary | ICD-10-CM | POA: Diagnosis not present

## 2021-12-22 DIAGNOSIS — R739 Hyperglycemia, unspecified: Secondary | ICD-10-CM | POA: Diagnosis not present

## 2021-12-22 DIAGNOSIS — R059 Cough, unspecified: Secondary | ICD-10-CM | POA: Diagnosis not present

## 2021-12-22 DIAGNOSIS — I10 Essential (primary) hypertension: Secondary | ICD-10-CM | POA: Diagnosis not present

## 2021-12-22 DIAGNOSIS — F418 Other specified anxiety disorders: Secondary | ICD-10-CM | POA: Diagnosis not present

## 2021-12-22 DIAGNOSIS — E7800 Pure hypercholesterolemia, unspecified: Secondary | ICD-10-CM | POA: Diagnosis not present

## 2021-12-22 DIAGNOSIS — Z23 Encounter for immunization: Secondary | ICD-10-CM | POA: Diagnosis not present

## 2021-12-22 DIAGNOSIS — R600 Localized edema: Secondary | ICD-10-CM | POA: Diagnosis not present

## 2021-12-22 DIAGNOSIS — E291 Testicular hypofunction: Secondary | ICD-10-CM | POA: Diagnosis not present

## 2021-12-22 DIAGNOSIS — Z125 Encounter for screening for malignant neoplasm of prostate: Secondary | ICD-10-CM | POA: Diagnosis not present

## 2021-12-22 DIAGNOSIS — E039 Hypothyroidism, unspecified: Secondary | ICD-10-CM | POA: Diagnosis not present

## 2022-01-05 DIAGNOSIS — M79644 Pain in right finger(s): Secondary | ICD-10-CM | POA: Diagnosis not present

## 2022-01-05 DIAGNOSIS — G5603 Carpal tunnel syndrome, bilateral upper limbs: Secondary | ICD-10-CM | POA: Diagnosis not present

## 2022-02-01 DIAGNOSIS — G5601 Carpal tunnel syndrome, right upper limb: Secondary | ICD-10-CM | POA: Diagnosis not present

## 2022-02-06 DIAGNOSIS — E039 Hypothyroidism, unspecified: Secondary | ICD-10-CM | POA: Diagnosis not present

## 2022-02-10 ENCOUNTER — Other Ambulatory Visit: Payer: Self-pay | Admitting: Cardiology

## 2022-02-10 DIAGNOSIS — I10 Essential (primary) hypertension: Secondary | ICD-10-CM

## 2022-02-12 DIAGNOSIS — D485 Neoplasm of uncertain behavior of skin: Secondary | ICD-10-CM | POA: Diagnosis not present

## 2022-02-12 DIAGNOSIS — C44319 Basal cell carcinoma of skin of other parts of face: Secondary | ICD-10-CM | POA: Diagnosis not present

## 2022-02-12 DIAGNOSIS — Z85828 Personal history of other malignant neoplasm of skin: Secondary | ICD-10-CM | POA: Diagnosis not present

## 2022-02-12 DIAGNOSIS — D1801 Hemangioma of skin and subcutaneous tissue: Secondary | ICD-10-CM | POA: Diagnosis not present

## 2022-02-12 DIAGNOSIS — Z08 Encounter for follow-up examination after completed treatment for malignant neoplasm: Secondary | ICD-10-CM | POA: Diagnosis not present

## 2022-02-12 DIAGNOSIS — L814 Other melanin hyperpigmentation: Secondary | ICD-10-CM | POA: Diagnosis not present

## 2022-02-12 DIAGNOSIS — L821 Other seborrheic keratosis: Secondary | ICD-10-CM | POA: Diagnosis not present

## 2022-02-16 DIAGNOSIS — R361 Hematospermia: Secondary | ICD-10-CM | POA: Diagnosis not present

## 2022-02-19 DIAGNOSIS — E291 Testicular hypofunction: Secondary | ICD-10-CM | POA: Diagnosis not present

## 2022-03-12 DIAGNOSIS — E291 Testicular hypofunction: Secondary | ICD-10-CM | POA: Diagnosis not present

## 2022-04-03 DIAGNOSIS — E291 Testicular hypofunction: Secondary | ICD-10-CM | POA: Diagnosis not present

## 2022-04-23 DIAGNOSIS — E291 Testicular hypofunction: Secondary | ICD-10-CM | POA: Diagnosis not present

## 2022-05-03 DIAGNOSIS — C44311 Basal cell carcinoma of skin of nose: Secondary | ICD-10-CM | POA: Diagnosis not present

## 2022-05-03 DIAGNOSIS — C4491 Basal cell carcinoma of skin, unspecified: Secondary | ICD-10-CM | POA: Diagnosis not present

## 2022-05-03 DIAGNOSIS — Z481 Encounter for planned postprocedural wound closure: Secondary | ICD-10-CM | POA: Diagnosis not present

## 2022-05-11 ENCOUNTER — Other Ambulatory Visit: Payer: Self-pay | Admitting: Cardiology

## 2022-05-11 DIAGNOSIS — I10 Essential (primary) hypertension: Secondary | ICD-10-CM

## 2022-05-14 DIAGNOSIS — E291 Testicular hypofunction: Secondary | ICD-10-CM | POA: Diagnosis not present

## 2022-06-06 DIAGNOSIS — E291 Testicular hypofunction: Secondary | ICD-10-CM | POA: Diagnosis not present

## 2022-06-06 DIAGNOSIS — D485 Neoplasm of uncertain behavior of skin: Secondary | ICD-10-CM | POA: Diagnosis not present

## 2022-06-06 DIAGNOSIS — C44319 Basal cell carcinoma of skin of other parts of face: Secondary | ICD-10-CM | POA: Diagnosis not present

## 2022-06-28 DIAGNOSIS — K5900 Constipation, unspecified: Secondary | ICD-10-CM | POA: Diagnosis not present

## 2022-06-28 DIAGNOSIS — M6208 Separation of muscle (nontraumatic), other site: Secondary | ICD-10-CM | POA: Diagnosis not present

## 2022-06-28 DIAGNOSIS — I10 Essential (primary) hypertension: Secondary | ICD-10-CM | POA: Diagnosis not present

## 2022-06-28 DIAGNOSIS — Z8719 Personal history of other diseases of the digestive system: Secondary | ICD-10-CM | POA: Diagnosis not present

## 2022-07-16 DIAGNOSIS — E291 Testicular hypofunction: Secondary | ICD-10-CM | POA: Diagnosis not present

## 2022-07-26 DIAGNOSIS — N529 Male erectile dysfunction, unspecified: Secondary | ICD-10-CM | POA: Diagnosis not present

## 2022-07-26 DIAGNOSIS — E291 Testicular hypofunction: Secondary | ICD-10-CM | POA: Diagnosis not present

## 2022-07-26 DIAGNOSIS — Z0000 Encounter for general adult medical examination without abnormal findings: Secondary | ICD-10-CM | POA: Diagnosis not present

## 2022-07-26 DIAGNOSIS — F418 Other specified anxiety disorders: Secondary | ICD-10-CM | POA: Diagnosis not present

## 2022-07-26 DIAGNOSIS — E7800 Pure hypercholesterolemia, unspecified: Secondary | ICD-10-CM | POA: Diagnosis not present

## 2022-07-26 DIAGNOSIS — I10 Essential (primary) hypertension: Secondary | ICD-10-CM | POA: Diagnosis not present

## 2022-07-26 DIAGNOSIS — I499 Cardiac arrhythmia, unspecified: Secondary | ICD-10-CM | POA: Diagnosis not present

## 2022-07-26 DIAGNOSIS — M109 Gout, unspecified: Secondary | ICD-10-CM | POA: Diagnosis not present

## 2022-07-26 DIAGNOSIS — F5101 Primary insomnia: Secondary | ICD-10-CM | POA: Diagnosis not present

## 2022-07-26 DIAGNOSIS — E039 Hypothyroidism, unspecified: Secondary | ICD-10-CM | POA: Diagnosis not present

## 2022-07-26 DIAGNOSIS — Z5181 Encounter for therapeutic drug level monitoring: Secondary | ICD-10-CM | POA: Diagnosis not present

## 2022-07-27 DIAGNOSIS — L0212 Furuncle of neck: Secondary | ICD-10-CM | POA: Diagnosis not present

## 2022-08-06 DIAGNOSIS — E291 Testicular hypofunction: Secondary | ICD-10-CM | POA: Diagnosis not present

## 2022-08-09 DIAGNOSIS — I499 Cardiac arrhythmia, unspecified: Secondary | ICD-10-CM | POA: Diagnosis not present

## 2022-08-20 DIAGNOSIS — E349 Endocrine disorder, unspecified: Secondary | ICD-10-CM | POA: Diagnosis not present

## 2022-08-20 DIAGNOSIS — E291 Testicular hypofunction: Secondary | ICD-10-CM | POA: Diagnosis not present

## 2022-09-12 DIAGNOSIS — E291 Testicular hypofunction: Secondary | ICD-10-CM | POA: Diagnosis not present

## 2022-09-13 DIAGNOSIS — R933 Abnormal findings on diagnostic imaging of other parts of digestive tract: Secondary | ICD-10-CM | POA: Diagnosis not present

## 2022-09-13 DIAGNOSIS — K5730 Diverticulosis of large intestine without perforation or abscess without bleeding: Secondary | ICD-10-CM | POA: Diagnosis not present

## 2022-09-13 DIAGNOSIS — R194 Change in bowel habit: Secondary | ICD-10-CM | POA: Diagnosis not present

## 2022-09-13 DIAGNOSIS — K6389 Other specified diseases of intestine: Secondary | ICD-10-CM | POA: Diagnosis not present

## 2022-09-13 DIAGNOSIS — R1032 Left lower quadrant pain: Secondary | ICD-10-CM | POA: Diagnosis not present

## 2022-09-13 DIAGNOSIS — K649 Unspecified hemorrhoids: Secondary | ICD-10-CM | POA: Diagnosis not present

## 2022-10-03 DIAGNOSIS — E291 Testicular hypofunction: Secondary | ICD-10-CM | POA: Diagnosis not present

## 2022-10-23 DIAGNOSIS — C44319 Basal cell carcinoma of skin of other parts of face: Secondary | ICD-10-CM | POA: Diagnosis not present

## 2022-10-25 DIAGNOSIS — E291 Testicular hypofunction: Secondary | ICD-10-CM | POA: Diagnosis not present

## 2022-11-22 DIAGNOSIS — G720 Drug-induced myopathy: Secondary | ICD-10-CM | POA: Diagnosis not present

## 2022-11-22 DIAGNOSIS — I10 Essential (primary) hypertension: Secondary | ICD-10-CM | POA: Diagnosis not present

## 2022-11-22 DIAGNOSIS — E291 Testicular hypofunction: Secondary | ICD-10-CM | POA: Diagnosis not present

## 2022-11-22 DIAGNOSIS — Z23 Encounter for immunization: Secondary | ICD-10-CM | POA: Diagnosis not present

## 2022-11-22 DIAGNOSIS — E663 Overweight: Secondary | ICD-10-CM | POA: Diagnosis not present

## 2022-11-22 DIAGNOSIS — E039 Hypothyroidism, unspecified: Secondary | ICD-10-CM | POA: Diagnosis not present

## 2022-11-22 DIAGNOSIS — E7800 Pure hypercholesterolemia, unspecified: Secondary | ICD-10-CM | POA: Diagnosis not present

## 2023-01-31 DIAGNOSIS — L578 Other skin changes due to chronic exposure to nonionizing radiation: Secondary | ICD-10-CM | POA: Diagnosis not present

## 2023-01-31 DIAGNOSIS — L570 Actinic keratosis: Secondary | ICD-10-CM | POA: Diagnosis not present

## 2023-02-01 DIAGNOSIS — F4321 Adjustment disorder with depressed mood: Secondary | ICD-10-CM | POA: Diagnosis not present

## 2023-02-18 DIAGNOSIS — E039 Hypothyroidism, unspecified: Secondary | ICD-10-CM | POA: Diagnosis not present

## 2023-02-18 DIAGNOSIS — I10 Essential (primary) hypertension: Secondary | ICD-10-CM | POA: Diagnosis not present

## 2023-02-18 DIAGNOSIS — Z125 Encounter for screening for malignant neoplasm of prostate: Secondary | ICD-10-CM | POA: Diagnosis not present

## 2023-02-18 DIAGNOSIS — E349 Endocrine disorder, unspecified: Secondary | ICD-10-CM | POA: Diagnosis not present

## 2023-03-26 DIAGNOSIS — D1801 Hemangioma of skin and subcutaneous tissue: Secondary | ICD-10-CM | POA: Diagnosis not present

## 2023-05-06 DIAGNOSIS — M5432 Sciatica, left side: Secondary | ICD-10-CM | POA: Diagnosis not present

## 2023-05-06 DIAGNOSIS — M7632 Iliotibial band syndrome, left leg: Secondary | ICD-10-CM | POA: Diagnosis not present

## 2023-05-10 DIAGNOSIS — M1712 Unilateral primary osteoarthritis, left knee: Secondary | ICD-10-CM | POA: Diagnosis not present

## 2023-06-07 DIAGNOSIS — M1712 Unilateral primary osteoarthritis, left knee: Secondary | ICD-10-CM | POA: Diagnosis not present

## 2023-06-20 ENCOUNTER — Other Ambulatory Visit (HOSPITAL_COMMUNITY): Payer: Self-pay | Admitting: Internal Medicine

## 2023-06-20 DIAGNOSIS — E785 Hyperlipidemia, unspecified: Secondary | ICD-10-CM

## 2023-06-20 DIAGNOSIS — R42 Dizziness and giddiness: Secondary | ICD-10-CM | POA: Diagnosis not present

## 2023-06-20 DIAGNOSIS — R002 Palpitations: Secondary | ICD-10-CM | POA: Diagnosis not present

## 2023-06-20 DIAGNOSIS — H9319 Tinnitus, unspecified ear: Secondary | ICD-10-CM | POA: Diagnosis not present

## 2023-07-03 DIAGNOSIS — M1712 Unilateral primary osteoarthritis, left knee: Secondary | ICD-10-CM | POA: Diagnosis not present

## 2023-07-15 ENCOUNTER — Ambulatory Visit (HOSPITAL_BASED_OUTPATIENT_CLINIC_OR_DEPARTMENT_OTHER)
Admission: RE | Admit: 2023-07-15 | Discharge: 2023-07-15 | Disposition: A | Discharge status: Home / Self Care | Payer: Medicare Other | Source: Ambulatory Visit | Attending: Internal Medicine | Admitting: Internal Medicine

## 2023-07-15 DIAGNOSIS — E785 Hyperlipidemia, unspecified: Secondary | ICD-10-CM | POA: Insufficient documentation

## 2023-07-18 DIAGNOSIS — H9313 Tinnitus, bilateral: Secondary | ICD-10-CM | POA: Diagnosis not present

## 2023-07-18 DIAGNOSIS — E7800 Pure hypercholesterolemia, unspecified: Secondary | ICD-10-CM | POA: Diagnosis not present

## 2023-07-18 DIAGNOSIS — I77810 Thoracic aortic ectasia: Secondary | ICD-10-CM | POA: Diagnosis not present

## 2023-07-18 DIAGNOSIS — R42 Dizziness and giddiness: Secondary | ICD-10-CM | POA: Diagnosis not present

## 2023-07-26 DIAGNOSIS — H9313 Tinnitus, bilateral: Secondary | ICD-10-CM | POA: Diagnosis not present

## 2023-07-26 DIAGNOSIS — I6522 Occlusion and stenosis of left carotid artery: Secondary | ICD-10-CM | POA: Diagnosis not present

## 2023-07-26 DIAGNOSIS — R42 Dizziness and giddiness: Secondary | ICD-10-CM | POA: Diagnosis not present

## 2023-08-16 DIAGNOSIS — M1712 Unilateral primary osteoarthritis, left knee: Secondary | ICD-10-CM | POA: Diagnosis not present

## 2023-09-11 DIAGNOSIS — F458 Other somatoform disorders: Secondary | ICD-10-CM | POA: Diagnosis not present

## 2023-09-11 DIAGNOSIS — I10 Essential (primary) hypertension: Secondary | ICD-10-CM | POA: Diagnosis not present

## 2023-09-11 DIAGNOSIS — R198 Other specified symptoms and signs involving the digestive system and abdomen: Secondary | ICD-10-CM | POA: Diagnosis not present

## 2023-09-18 DIAGNOSIS — E7800 Pure hypercholesterolemia, unspecified: Secondary | ICD-10-CM | POA: Diagnosis not present

## 2023-09-18 DIAGNOSIS — Z23 Encounter for immunization: Secondary | ICD-10-CM | POA: Diagnosis not present

## 2023-09-18 DIAGNOSIS — Z5181 Encounter for therapeutic drug level monitoring: Secondary | ICD-10-CM | POA: Diagnosis not present

## 2023-09-18 DIAGNOSIS — I10 Essential (primary) hypertension: Secondary | ICD-10-CM | POA: Diagnosis not present

## 2023-09-18 DIAGNOSIS — E349 Endocrine disorder, unspecified: Secondary | ICD-10-CM | POA: Diagnosis not present

## 2023-09-18 DIAGNOSIS — E039 Hypothyroidism, unspecified: Secondary | ICD-10-CM | POA: Diagnosis not present

## 2023-09-18 DIAGNOSIS — F5101 Primary insomnia: Secondary | ICD-10-CM | POA: Diagnosis not present

## 2023-09-18 DIAGNOSIS — Z0000 Encounter for general adult medical examination without abnormal findings: Secondary | ICD-10-CM | POA: Diagnosis not present

## 2023-11-01 DIAGNOSIS — K219 Gastro-esophageal reflux disease without esophagitis: Secondary | ICD-10-CM | POA: Diagnosis not present

## 2023-11-14 ENCOUNTER — Encounter: Payer: Self-pay | Admitting: Internal Medicine

## 2023-11-14 ENCOUNTER — Ambulatory Visit: Payer: Medicare Other | Admitting: Internal Medicine

## 2023-11-14 VITALS — BP 138/88 | HR 60 | Temp 98.0°F | Ht 68.0 in | Wt 236.2 lb

## 2023-11-14 DIAGNOSIS — R0989 Other specified symptoms and signs involving the circulatory and respiratory systems: Secondary | ICD-10-CM

## 2023-11-14 DIAGNOSIS — R062 Wheezing: Secondary | ICD-10-CM

## 2023-11-14 DIAGNOSIS — K219 Gastro-esophageal reflux disease without esophagitis: Secondary | ICD-10-CM | POA: Diagnosis not present

## 2023-11-14 LAB — POCT EXHALED NITRIC OXIDE: FeNO level (ppb): 18

## 2023-11-14 MED ORDER — ALBUTEROL SULFATE HFA 108 (90 BASE) MCG/ACT IN AERS: 2.0000 | INHALATION_SPRAY | Freq: Four times a day (QID) | RESPIRATORY_TRACT | 5 refills | Status: AC | PRN

## 2023-11-14 NOTE — Progress Notes (Signed)
[name]    [id number]    1951/12/06  Primary Care Physician:Polite, Windy Fast, MD  Referring Physician: Renford Dills, MD [address] [place],  [place] [address] Reason for Consultation: throat clearing Date of Consultation: 11/14/2023  Chief complaint:   Chief Complaint  Patient presents with   Consult    Clearing throat, chest tightness and wheeezing.     HPI: Michael Melendez is a 72 y.o. man who presents for new patient evaluation for throat clearing. He saw ENT last week and was diagnosed with LPR. Symptoms have been going on 10+ years.   He started having wheezing a few months ago which is a new symptom. Sometimes the wheezing wakes him up at night. Also with bending forward. Denies worsening wheezing with exertion.   He has had knee pain in the last couple months so is no longer walking and playing raquetball as he was before. He is now starting to have shortness of breath with exertion.   He has had pneumonia before back in the 1980s. He was not hospitalized.  No childhood respiratory disease.   Drinks wine 5-6 times/week. Has gained weight due to alcohol use.  Has a lot of stress - 3 of his previous spouses have died from terminal cancer  Has been on inhalers in the past when he had  URI.   He is on testosterone supplementation.  He feels he mucinex gives him some relief.   Social history:  Occupation: office work, retired.  Exposures:grew up in [place], lived in [place] since 1970s. Has a pet cat. Likes to travel with his wife (they don't live together) Smoking history: never smoker  Social History   Occupational History   Not on file  Tobacco Use   Smoking status: Never   Smokeless tobacco: Never  Vaping Use   Vaping status: Never Used  Substance and Sexual Activity   Alcohol use: Yes    Alcohol/week: 7.0 standard drinks of alcohol    Types: 7 Glasses of wine per week    Comment: 1 bootle of wine per week   Drug use: No    Sexual activity: Not on file    Relevant family history:  Family History  Problem Relation Age of Onset   Dementia Mother    AAA (abdominal aortic aneurysm) Father    Dementia Father    AAA (abdominal aortic aneurysm) Sister    Atrial fibrillation Sister    Cervical cancer Sister    Anesthesia problems Neg Hx    Lung disease Neg Hx     Past Medical History:  Diagnosis Date   Carpal tunnel syndrome    on right pt says surgery 12-24-11 never occurred   Depression    Diverticulitis    Gout    History of kidney stones    none in years   Hyperlipidemia    Hypertension     Past Surgical History:  Procedure Laterality Date   ANKLE RECONSTRUCTION Bilateral 1996   COLONOSCOPY WITH PROPOFOL N/A 07/27/2014   Procedure: COLONOSCOPY WITH PROPOFOL;  Surgeon: Charolett Bumpers, MD;  Location: [facility];  Service: Endoscopy;  Laterality: N/A;   ELBOW BURSA SURGERY Right    SHOULDER ARTHROSCOPY Bilateral 1980   rotator cuff repair     Physical Exam: Blood pressure 138/88, pulse 60, temperature 98 F (36.7 C), temperature source Oral, height 5\' 8"  (1.727 m), weight 236 lb 3.2 oz (107.1 kg), SpO2 98%.  Gen:      No acute distress, frequent throat clearing ENT:  +cobblestoning, no nasal polyps, mucus membranes moist Lungs:    No increased respiratory effort, symmetric chest wall excursion, clear to auscultation bilaterally, no wheezes or crackles CV:         Regular rate and rhythm; no murmurs, rubs, or gallops.  No pedal edema Abd:      + bowel sounds; soft, non-tender; obese MSK: no acute synovitis of DIP or PIP joints, no mechanics hands.  Skin:      Warm and dry; no rashes Neuro: normal speech, no focal facial asymmetry Psych: alert and oriented x3, normal mood and affect   Data Reviewed/Medical Decision Making:  Independent interpretation of tests: Imaging:  Review of patient's CT cardiac scoring in July 2024 images revealed small granuloma LLL, otherwise visualized lung  fields are clear. The patient's images have been independently reviewed by me.    PFTs: I have personally reviewed the patient's PFTs and      No data to display          Labs:  Lab Results  Component Value Date   WBC 3.8 (L) 10/23/2020   HGB 11.6 (L) 10/23/2020   HCT 34.5 (L) 10/23/2020   MCV 94.0 10/23/2020   PLT 205 10/23/2020   Lab Results  Component Value Date   NA 139 10/23/2020   K 3.8 10/23/2020   CO2 24 10/23/2020   GLUCOSE 96 10/23/2020   BUN 9 10/23/2020   CREATININE 1.10 10/23/2020   CALCIUM 9.0 10/23/2020   GFRNONAA >60 10/23/2020      Immunization status:  Immunization History  Administered Date(s) Administered   PFIZER(Purple Top)SARS-COV-2 Vaccination 02/01/2020, 02/26/2020     I reviewed prior external note(s) from ENT  I reviewed the result(s) of the labs and imaging as noted above.   I have ordered feno   Assessment:  LPRD/GERD Possible asthma  Plan/Recommendations: FENO today 18 ppb.   I think your throat symptoms are related mostly to reflux. Keep taking acid reflux medicine twice daily.  Alcohol is making this problem worse.   We can try an inhaler called albuterol to see if it helps your shortness of breath. Very low on the differential would be asthma.   I have ordered allergy testing to be done with blood work.   We discussed disease management and progression at length today.    Return to Care: Return in about 3 months (around 02/14/2024).  Durel Salts, MD Pulmonary and Critical Care Medicine [facility] [phone]  CC: Renford Dills, MD

## 2023-11-14 NOTE — Patient Instructions (Addendum)
It was a pleasure to see you today!  Please schedule follow up scheduled with myself in 3 months.  If my schedule is not open yet, we will contact you with a reminder closer to that time. Please call [phone] if you haven't heard from [place] a month before, and always call us sooner if issues or concerns arise. You can also send [place] a message through MyChart, but but aware that this is not to be used for urgent issues and it may take up to 5-7 days to receive a reply. Please be aware that you will likely be able to view your results before I have a chance to respond to them. Please give [place] 5 business days to respond to any non-urgent results.     I think your throat symptoms are related mostly to reflux. Keep taking acid reflux medicine twice daily.  Alcohol is making this problem worse.   We can try an inhaler called albuterol to see if it helps your shortness of breath.  I have ordered allergy testing to be done with blood work.   Take the albuterol rescue inhaler every 4 to 6 hours as needed for wheezing or shortness of breath. You can also take it 15 minutes before exercise or exertional activity. Side effects include heart racing or pounding, jitters or anxiety. If you have a history of an irregular heart rhythm, it can make this worse. Can also give some patients a hard time sleeping.  To inhale the aerosol using an inhaler, follow these steps:  Remove the protective dust cap from the end of the mouthpiece. If the dust cap was not placed on the mouthpiece, check the mouthpiece for dirt or other objects. Be sure that the canister is fully and firmly inserted in the mouthpiece. 2. If you are using the inhaler for the first time or if you have not used the inhaler in more than 14 days, you will need to prime it. You may also need to prime the inhaler if it has been dropped. Ask your pharmacist or check the manufacturer's information if this happens. To prime the inhaler, shake it well and then  press down on the canister 4 times to release 4 sprays into the air, away from your face. Be careful not to get albuterol in your eyes. 3. Shake the inhaler well. 4. Breathe out as completely as possible through your mouth. 4. Hold the canister with the mouthpiece on the bottom, facing you and the canister pointing upward. Place the open end of the mouthpiece into your mouth. Close your lips tightly around the mouthpiece. 6. Breathe in slowly and deeply through the mouthpiece.At the same time, press down once on the container to spray the medication into your mouth. 7. Try to hold your breath for 10 seconds. remove the inhaler, and breathe out slowly. 8. If you were told to use 2 puffs, wait 1 minute and then repeat steps 3-7. 9. Replace the protective cap on the inhaler. 10. Clean your inhaler regularly. Follow the manufacturer's directions carefully and ask your doctor or pharmacist if you have any questions about cleaning your inhaler.  Check the back of the inhaler to keep track of the total number of doses left on the inhaler.      What is GERD? Gastroesophageal reflux disease (GERD) is gastroesophageal reflux diseasewhich occurs when the lower esophageal sphincter (LES) opens spontaneously, for varying periods of time, or does not close properly and stomach contents rise up into the  esophagus. GER is also called acid reflux or acid regurgitation, because digestive juices--called acids--rise up with the food. The esophagus is the tube that carries food from the mouth to the stomach. The LES is a ring of muscle at the bottom of the esophagus that acts like a valve between the esophagus and stomach.  When acid reflux occurs, food or fluid can be tasted in the back of the mouth. When refluxed stomach acid touches the lining of the esophagus it may cause a burning sensation in the chest or throat called heartburn or acid indigestion. Occasional reflux is common. Persistent reflux that occurs more  than twice a week is considered GERD, and it can eventually lead to more serious health problems. People of all ages can have GERD. Studies have shown that GERD may worsen or contribute to asthma, chronic cough, and pulmonary fibrosis.   What are the symptoms of GERD? The main symptom of GERD in adults is frequent heartburn, also called acid indigestion--burning-type pain in the lower part of the mid-chest, behind the breast bone, and in the mid-abdomen.  Not all reflux is acidic in nature, and many patients don't have heart burn at all. Sometimes it feels like a cough (either dry or with mucus), choking sensation, asthma, shortness of breath, waking up at night, frequent throat clearing, or trouble swallowing.    What causes GERD? The reason some people develop GERD is still unclear. However, research shows that in people with GERD, the LES relaxes while the rest of the esophagus is working. Anatomical abnormalities such as a hiatal hernia may also contribute to GERD. A hiatal hernia occurs when the upper part of the stomach and the LES move above the diaphragm, the muscle wall that separates the stomach from the chest. Normally, the diaphragm helps the LES keep acid from rising up into the esophagus. When a hiatal hernia is present, acid reflux can occur more easily. A hiatal hernia can occur in people of any age and is most often a normal finding in otherwise healthy people over age 50. Most of the time, a hiatal hernia produces no symptoms.   Other factors that may contribute to GERD include - Obesity or recent weight gain - Pregnancy  - Smoking  - Diet - Certain medications  Common foods that can worsen reflux symptoms include: - carbonated beverages - artificial sweeteners - citrus fruits  - chocolate  - drinks with caffeine or alcohol  - fatty and fried foods  - garlic and onions  - mint flavorings  - spicy foods  - tomato-based foods, like spaghetti sauce, salsa, chili, and pizza    Lifestyle Changes If you smoke, stop.  Avoid foods and beverages that worsen symptoms (see above.) Lose weight if needed.  Eat small, frequent meals.  Wear loose-fitting clothes.  Avoid lying down for 3 hours after a meal.  Raise the head of your bed 6 to 8 inches by securing wood blocks under the bedposts. Just using extra pillows will not help, but using a wedge-shaped pillow may be helpful.  Medications  H2 blockers, such as cimetidine (Tagamet HB), famotidine (Pepcid AC), nizatidine (Axid AR), and ranitidine (Zantac 75), decrease acid production. They are available in prescription strength and over-the-counter strength. These drugs provide short-term relief and are effective for about half of those who have GERD symptoms.  Proton pump inhibitors include omeprazole (Prilosec, Zegerid), lansoprazole (Prevacid), pantoprazole (Protonix), rabeprazole (Aciphex), and esomeprazole (Nexium), which are available by prescription. Prilosec is also available in  over-the-counter strength. Proton pump inhibitors are more effective than H2 blockers and can relieve symptoms and heal the esophageal lining in almost everyone who has GERD.  Because drugs work in different ways, combinations of medications may help control symptoms. People who get heartburn after eating may take both antacids and H2 blockers. The antacids work first to neutralize the acid in the stomach, and then the H2 blockers act on acid production. By the time the antacid stops working, the H2 blocker will have stopped acid production. Your health care provider is the best source of information about how to use medications for GERD.   Points to Remember 1. You can have GERD without having heartburn. Your symptoms could include a dry cough, asthma symptoms, or trouble swallowing.  2. Taking medications daily as prescribed is important in controlling you symptoms.  Sometimes it can take up to 8 weeks to fully achieve the effects of the  medications prescribed.  3. Coughing related to GERD can be difficult to treat and is very frustrating!  However, it is important to stick with these medications and lifestyle modifications before pursuing more aggressive or invasive test and treatments.

## 2023-11-15 LAB — RESPIRATORY ALLERGY PROFILE REGION II ~~LOC~~
Allergen, A. alternata, m6: <0.1 kU/L
Allergen, Cedar tree, t12: <0.1 kU/L
Allergen, Comm Silver Birch, t9: <0.1 kU/L
Allergen, Cottonwood, t14: <0.1 kU/L
Allergen, D pternoyssinus,d7: 0.22 kU/L — ABNORMAL HIGH
Allergen, Mouse Urine Protein, e78: <0.1 kU/L
Allergen, Mulberry, t76: <0.1 kU/L
Allergen, Oak,t7: <0.1 kU/L
Allergen, P. notatum, m1: <0.1 kU/L
Aspergillus fumigatus, m3: <0.1 kU/L
Bermuda Grass: <0.1 kU/L
Box Elder IgE: <0.1 kU/L
CLADOSPORIUM HERBARUM (M2) IGE: <0.1 kU/L
COMMON RAGWEED (SHORT) (W1) IGE: <0.1 kU/L
Cat Dander: <0.1 kU/L
Class: 0
Class: 0
Class: 0
Class: 0
Class: 0
Class: 0
Class: 0
Class: 0
Class: 0
Class: 0
Class: 0
Class: 0
Class: 0
Class: 0
Class: 0
Class: 0
Class: 0
Class: 0
Class: 0
Class: 0
Class: 0
Cockroach: 0.15 kU/L — ABNORMAL HIGH
D. farinae: 0.27 kU/L — ABNORMAL HIGH
Dog Dander: <0.1 kU/L
Elm IgE: <0.1 kU/L
IgE (Immunoglobulin E), Serum: 32 kU/L (ref <=114)
Johnson Grass: <0.1 kU/L
Pecan/Hickory Tree IgE: <0.1 kU/L
Rough Pigweed  IgE: <0.1 kU/L
Sheep Sorrel IgE: <0.1 kU/L
Timothy Grass: <0.1 kU/L

## 2023-11-15 LAB — INTERPRETATION:

## 2024-01-03 ENCOUNTER — Telehealth: Payer: Self-pay | Admitting: Cardiology

## 2024-01-03 ENCOUNTER — Encounter (HOSPITAL_COMMUNITY): Payer: Self-pay

## 2024-01-03 ENCOUNTER — Emergency Department (HOSPITAL_COMMUNITY)
Admission: EM | Admit: 2024-01-03 | Discharge: 2024-01-03 | Disposition: A | Discharge status: Home / Self Care | Payer: Medicare Other | Attending: Emergency Medicine | Admitting: Emergency Medicine

## 2024-01-03 ENCOUNTER — Other Ambulatory Visit: Payer: Self-pay

## 2024-01-03 DIAGNOSIS — E7800 Pure hypercholesterolemia, unspecified: Secondary | ICD-10-CM | POA: Diagnosis not present

## 2024-01-03 DIAGNOSIS — E669 Obesity, unspecified: Secondary | ICD-10-CM | POA: Diagnosis not present

## 2024-01-03 DIAGNOSIS — I4891 Unspecified atrial fibrillation: Secondary | ICD-10-CM | POA: Diagnosis not present

## 2024-01-03 DIAGNOSIS — E039 Hypothyroidism, unspecified: Secondary | ICD-10-CM | POA: Diagnosis not present

## 2024-01-03 DIAGNOSIS — I10 Essential (primary) hypertension: Secondary | ICD-10-CM | POA: Diagnosis not present

## 2024-01-03 DIAGNOSIS — I4892 Unspecified atrial flutter: Secondary | ICD-10-CM | POA: Diagnosis not present

## 2024-01-03 LAB — CBC WITH DIFFERENTIAL/PLATELET
Abs Immature Granulocytes: 0.02 10*3/uL (ref 0.00–0.07)
Basophils Absolute: 0.1 10*3/uL (ref 0.0–0.1)
Basophils Relative: 1 %
Eosinophils Absolute: 0.4 10*3/uL (ref 0.0–0.5)
Eosinophils Relative: 5 %
HCT: 45.2 % (ref 39.0–52.0)
Hemoglobin: 15.2 g/dL (ref 13.0–17.0)
Immature Granulocytes: 0 %
Lymphocytes Relative: 27 %
Lymphs Abs: 2.1 10*3/uL (ref 0.7–4.0)
MCH: 31.7 pg (ref 26.0–34.0)
MCHC: 33.6 g/dL (ref 30.0–36.0)
MCV: 94.2 fL (ref 80.0–100.0)
Monocytes Absolute: 0.8 10*3/uL (ref 0.1–1.0)
Monocytes Relative: 10 %
Neutro Abs: 4.6 10*3/uL (ref 1.7–7.7)
Neutrophils Relative %: 57 %
Platelets: 194 10*3/uL (ref 150–400)
RBC: 4.8 MIL/uL (ref 4.22–5.81)
RDW: 13.4 % (ref 11.5–15.5)
WBC: 7.8 10*3/uL (ref 4.0–10.5)
nRBC: 0 % (ref 0.0–0.2)

## 2024-01-03 LAB — COMPREHENSIVE METABOLIC PANEL
ALT: 23 U/L (ref 0–44)
AST: 24 U/L (ref 15–41)
Albumin: 3.2 g/dL — ABNORMAL LOW (ref 3.5–5.0)
Alkaline Phosphatase: 65 U/L (ref 38–126)
Anion gap: 8 (ref 5–15)
BUN: 12 mg/dL (ref 8–23)
CO2: 26 mmol/L (ref 22–32)
Calcium: 8.8 mg/dL — ABNORMAL LOW (ref 8.9–10.3)
Chloride: 107 mmol/L (ref 98–111)
Creatinine, Ser: 1.26 mg/dL — ABNORMAL HIGH (ref 0.61–1.24)
GFR, Estimated: 60 mL/min — ABNORMAL LOW (ref >60)
Glucose, Bld: 86 mg/dL (ref 70–99)
Potassium: 4.4 mmol/L (ref 3.5–5.1)
Sodium: 141 mmol/L (ref 135–145)
Total Bilirubin: 0.4 mg/dL (ref 0.0–1.2)
Total Protein: 6 g/dL — ABNORMAL LOW (ref 6.5–8.1)

## 2024-01-03 LAB — TSH: TSH: 2.114 u[IU]/mL (ref 0.350–4.500)

## 2024-01-03 LAB — MAGNESIUM: Magnesium: 2 mg/dL (ref 1.7–2.4)

## 2024-01-03 MED ORDER — SPIRONOLACTONE 12.5 MG HALF TABLET
25.0000 mg | ORAL_TABLET | ORAL | Status: AC
  Administered 2024-01-03: 25 mg via ORAL
  Filled 2024-01-03: qty 2

## 2024-01-03 MED ORDER — SPIRONOLACTONE 25 MG PO TABS: 25.0000 mg | ORAL_TABLET | Freq: Every day | ORAL | 0 refills | Status: DC

## 2024-01-03 MED ORDER — APIXABAN 5 MG PO TABS: 5.0000 mg | ORAL_TABLET | Freq: Two times a day (BID) | ORAL | 0 refills | Status: DC

## 2024-01-03 MED ORDER — HYDROXYZINE HCL 25 MG PO TABS
50.0000 mg | ORAL_TABLET | Freq: Once | ORAL | Status: AC
  Administered 2024-01-03: 50 mg via ORAL
  Filled 2024-01-03: qty 2

## 2024-01-03 MED ORDER — APIXABAN 5 MG PO TABS
5.0000 mg | ORAL_TABLET | Freq: Two times a day (BID) | ORAL | Status: DC
  Administered 2024-01-03: 5 mg via ORAL
  Filled 2024-01-03: qty 1

## 2024-01-03 NOTE — ED Provider Notes (Signed)
 I saw and evaluated the patient, reviewed the resident's note and I agree with the findings and plan.      ED ECG REPORT   Date: 01/03/2024  Rate: 60  Rhythm: atrial fibrillation  QRS Axis: normal  Intervals: normal  ST/T Wave abnormalities: nonspecific ST changes  Conduction Disutrbances:none  Narrative Interpretation:   Old EKG Reviewed: none available  I have personally reviewed the EKG tracing and agree with the computerized printout as noted.   34 old male presents with irregular heartbeat.  Patient has history of A-fib in the past and his Apple Watch showed that he has been in A-fib for several days.  No ACS type symptoms.  EKG per interpretation shows A-fib.  Plan will be to admit to medicine with a cardiology consult.   Dasie Faden, MD 01/03/24 [phone]

## 2024-01-03 NOTE — Discharge Instructions (Addendum)
 You have been seen here in the emergency department for new onset atrial fibrillation.  We are starting you on a blood thinner called Eliquis , as well as another blood pressure medication called spironolactone .  Please follow-up with cardiology as they have arranged appointment for you.  Understanding that you are now on a blood thinner, you are more easily able to bleed

## 2024-01-03 NOTE — Consult Note (Signed)
 Cardiology Consultation   Patient ID: Michael Melendez MRN: [id number]; DOB: November 04, 1951  Admit date: 01/03/2024 Date of Consult: 01/03/2024  PCP:  Rexanne Ingle, MD   [facility] Providers Cardiologist: Gordy Bergamo, MD Click here to update MD or APP on Care Team, Refresh:1}     Patient Profile:   Michael Melendez is a 73 y.o.Caucasian male with hypertension, history of kidney stones, hyperlipidemia, prior tobacco use disorder, gout who was seen in the emergency room on  at the request of Dr. Koren Dawn.  History of Present Illness:   Michael Melendez is a 73 y.o. who was in his usual state of health, wears a smart watch, and it recurrently told him that he was in atrial fibrillation.  Hence he presented to his PCP urgent walk-in clinic where he was found to be in A-fib, he was sent by EMS for further evaluation to the emergency room.  Patient completely asymptomatic without chest pain or dyspnea.  He has not noticed any difference in how he feels for the past several days.  Only in the past 3 days his watch kept stating that he is in atrial fibrillation and hence wanted to be further evaluated.   Past Medical History:  Diagnosis Date   Carpal tunnel syndrome    on right pt says surgery 12-24-11 never occurred   Depression    Diverticulitis    Gout    History of kidney stones    none in years   Hyperlipidemia    Hypertension     Past Surgical History:  Procedure Laterality Date   ANKLE RECONSTRUCTION Bilateral 1996   COLONOSCOPY WITH PROPOFOL  N/A 07/27/2014   Procedure: COLONOSCOPY WITH PROPOFOL ;  Surgeon: Gladis MARLA Louder, MD;  Location: [facility];  Service: Endoscopy;  Laterality: N/A;   ELBOW BURSA SURGERY Right    SHOULDER ARTHROSCOPY Bilateral 1980   rotator cuff repair    Allergies:    Allergies  Allergen Reactions   Ciprofloxacin Hives   Lisinopril Anaphylaxis   Dog Epithelium Other (See Comments)    Stuffy nose & eye watering.   Losartan Swelling   Other  Other (See Comments)    Stuffy nose & eye watering.  PET DANDER   Social History   Tobacco Use   Smoking status: Never   Smokeless tobacco: Never  Substance Use Topics   Alcohol use: Yes    Alcohol/week: 7.0 standard drinks of alcohol    Types: 7 Glasses of wine per week    Comment: 1 bootle of wine per week   Marital status: Married    Family History:    Family History  Problem Relation Age of Onset   Dementia Mother    AAA (abdominal aortic aneurysm) Father    Dementia Father    AAA (abdominal aortic aneurysm) Sister    Atrial fibrillation Sister    Cervical cancer Sister    Anesthesia problems Neg Hx    Lung disease Neg Hx      ROS:  Review of Systems  Cardiovascular:  Negative for chest pain, dyspnea on exertion, leg swelling, palpitations and syncope.    Physical Exam    Vitals:   01/03/24 1423 01/03/24 1426 01/03/24 1427 01/03/24 1430  BP: (!) 163/108     Pulse:   83   Resp: 20  17   Temp:  98.2 F (36.8 C)    TempSrc:  Oral    SpO2:   99%   Weight:  104.3 kg  Height:    5' 8 (1.727 m)   Physical Exam Constitutional:      Appearance: He is obese.  Neck:     Vascular: No carotid bruit or JVD.  Cardiovascular:     Rate and Rhythm: Normal rate. Rhythm irregular.     Pulses: Normal pulses and intact distal pulses.     Heart sounds: No murmur heard. Pulmonary:     Effort: Pulmonary effort is normal.     Breath sounds: Normal breath sounds.  Abdominal:     General: Bowel sounds are normal.     Palpations: Abdomen is soft.  Musculoskeletal:     Right lower leg: No edema.     Left lower leg: No edema.  Skin:    Capillary Refill: Capillary refill takes less than 2 seconds.        01/03/2024    2:30 PM 11/14/2023    1:36 PM 10/21/2020    4:16 PM  Last 3 Weights  Weight (lbs) 230 lb 236 lb 3.2 oz 210 lb  Weight (kg) 104.327 kg 107.14 kg 95.255 kg     Net IO Since Admission: No IO data has been entered for this period [01/03/24  1852]  Labs   Lab Results  Component Value Date   NA 141 01/03/2024   K 4.4 01/03/2024   CO2 26 01/03/2024   GLUCOSE 86 01/03/2024   BUN 12 01/03/2024   CREATININE 1.26 (H) 01/03/2024   CALCIUM  8.8 (L) 01/03/2024   GFRNONAA 60 (L) 01/03/2024       Latest Ref Rng & Units 01/03/2024    2:42 PM 10/23/2020    5:39 AM 10/22/2020    4:52 AM  BMP  Glucose 70 - 99 mg/dL 86  96  99   BUN 8 - 23 mg/dL 12  9  13    Creatinine 0.61 - 1.24 mg/dL 8.73  8.89  8.90   Sodium 135 - 145 mmol/L 141  139  140   Potassium 3.5 - 5.1 mmol/L 4.4  3.8  4.1   Chloride 98 - 111 mmol/L 107  107  107   CO2 22 - 32 mmol/L 26  24  26    Calcium  8.9 - 10.3 mg/dL 8.8  9.0  9.0        Latest Ref Rng & Units 01/03/2024    2:42 PM 10/23/2020    5:39 AM 10/22/2020    4:52 AM  CBC  WBC 4.0 - 10.5 K/uL 7.8  3.8  5.3   Hemoglobin 13.0 - 17.0 g/dL 84.7  88.3  87.8   Hematocrit 39.0 - 52.0 % 45.2  34.5  36.0   Platelets 150 - 400 K/uL 194  205  226     Lab Results  Component Value Date   CHOL 184 02/26/2020   HDL 68 02/26/2020   LDLCALC 95 02/26/2020   LDLDIRECT 100 (H) 02/26/2020   TRIG 122 02/26/2020    Lab Results  Component Value Date   TSH 2.114 01/03/2024   Current Meds  Medication Sig   acebutolol  (SECTRAL ) 200 MG capsule TAKE 1 CAPSULE TWICE DAILY   acetaminophen  (TYLENOL ) 500 MG tablet Take 1,000 mg by mouth every 6 (six) hours as needed for mild pain (pain score 1-3).   allopurinol  (ZYLOPRIM ) 300 MG tablet Take 300 mg by mouth daily.    aspirin EC 81 MG tablet Take 81 mg by mouth daily.   atorvastatin  (LIPITOR) 10 MG tablet Take 10 mg by  mouth See admin instructions. Take 1 tablet by mouth 3 times weekly   eszopiclone  (LUNESTA ) 1 MG TABS tablet Take 1 mg by mouth at bedtime.   levothyroxine  (SYNTHROID ) 100 MCG tablet Take 1 tablet by mouth daily.   Multiple Vitamin (MULTIVITAMIN WITH MINERALS) TABS tablet Take 1 tablet by mouth daily.   omeprazole (PRILOSEC) 40 MG capsule Take 40 mg by  mouth 2 (two) times daily before a meal.   testosterone  cypionate (DEPOTESTOSTERONE CYPIONATE) 200 MG/ML injection Inject 1.5 mLs into the muscle See admin instructions. Every 3 weeks     Tele/EKG/Cardiac studies    EKG:  EKG 01/03/2024: Atypical atrial flutter with controlled ventricular sponsor at the rate of 83 bpm, normal axis, poor R progression, cannot exclude anteroseptal infarct 4.  Nonspecific T abnormality.  Compared to 10/21/2020, sinus rhythm has been replaced, otherwise no significant change.  Coronary calcium  score 07/16/2023 Total Agatston coronary calcium  score 173. MESA database percentile 47. LM: 0 LAD: 157 LCx: 14.3 RCA: 1.8 Visualized ascending and descending aorta mildly dilated at 40 mm. Extracardiac abnormalities: No significant extracardiac abnormality.   Radiology     Assessment & Plan .     New onset atrial fibrillation with controlled ventricular response. Primary hypertension. Moderate obesity. Mild aortic root dilatation.  Click Here to Calculate/Change CHADS2VASc Score The patient's CHADS2-VASc score is 2, indicating a 2.2% annual risk of stroke.  Therefore, anticoagulation is recommended.   CHF History: No HTN History: Yes Diabetes History: No Stroke History: No Vascular Disease History: No  Recommendation: Patient is asymptomatic and no clinical evidence of heart failure.  Will follow-up on 1 set of troponin if negative, can be discharged home with Eliquis  5 mg p.o. twice daily and I will set him up to be seen in the outpatient A-fib clinic.  Although his CHA2DS2-VASc score is only 2, we could consider discontinuing anticoagulation but would like to further evaluate his risk factors as I have not seen him in many years.  He will need cardiac stress testing to exclude myocardial ischemia in view of hypertension, hypercholesterolemia and not on statin therapy until recently.  Blood pressure was elevated but related to ED visit, however he would  benefit from ARB, however has anaphylaxis to losartan and also ACE inhibitor hence we will start him on spironolactone  25 mg daily. Needs BMP on follow up  In view of moderate obesity, new onset atrial fibrillation, he will need sleep study and this can be performed by his PCP.   Gordy Bergamo, MD, [facility] 01/03/2024, 6:52 PM [facility] [address] [place], [place] [address] Phone: [phone]. Fax:  [phone]  C: [phone]

## 2024-01-03 NOTE — ED Provider Triage Note (Signed)
 Emergency Medicine Provider Triage Evaluation Note  Michael Melendez , a 73 y.o. male  was evaluated in triage.  Pt complains of intermittent A-fib.  He states his Apple Watch has been given a modification for the past 3 to 4 days that he has been in A-fib.  Not on anticoagulation.  He states similar modification has been given to him in the past.  He states he wore a Holter monitor but did not-yield much.  Currently without chest pain, palpitations, dyspnea or other complaints.  Review of Systems  Positive: As above Negative: As above  Physical Exam  BP (!) 163/108   Pulse 83   Temp 98.2 F (36.8 C) (Oral)   Resp 17   Ht 5' 8 (1.727 m)   Wt 104.3 kg   SpO2 99%   BMI 34.97 kg/m  Gen:   Awake, no distress   Resp:  Normal effort  MSK:   Moves extremities without difficulty  Other:    Medical Decision Making  Medically screening exam initiated at 2:42 PM.  Appropriate orders placed.  Michael Melendez was informed that the remainder of the evaluation will be completed by another provider, this initial triage assessment does not replace that evaluation, and the importance of remaining in the ED until their evaluation is complete.     Hildegard Loge, PA-C 01/03/24 1442

## 2024-01-03 NOTE — ED Triage Notes (Signed)
 PT BIB [facility] from [facility] urgent care this morning after PT arrived there to verify his smartwatch telling him he was in A-fib. PT stated this had been going on for 3 days. Physicians at the urgent care referred him to the ED and [facility] verified intermittent A-fib while in transport. [facility] HR 70-90, BP 150/90. PT denies chest pain, weakness, SOB or dyspnea. Aox4.

## 2024-01-03 NOTE — ED Provider Notes (Addendum)
 [facility] AT [facility] Provider Note  HPI   Michael Melendez is a 73 y.o. male patient with a PMHx of hypertension hypothyroidism diverticulitis who is here today with concern for atrial fibrillation versus flutter.  Patient states that a few years ago, he had atrial flutter versus fibrillation, and states that it resolved on its own, did not require any additional workup.  However over the past couple of days, he has been doing EKG on his Apple Watch, and is reading atrial fibrillation.  He has had no symptoms at all over the past week no chest pain no shortness of breath no abdominal pain he is not symptomatic during these episodes, however he does state over the past day or 2, he is actually noticed his Apple Watch going off just from having a fast heart rate.      ROS Negative except as per HPI   Medical Decision Making   Upon presentation, the patient is afebrile, blood pressure in the 150s and 160s, he is hemodynamically stable, his heart rate is jumping around 70s and 80s in the room and it looks irregular on the monitor  We are going to get a stat EKG, and I will obtain some basic labs including CBC CMP TSH and magnesium make sure there is no electrolyte or metabolic or thyroid  abnormality that is causing this, it could be primary cardiac in nature, I do not think this is ischemic, he does not have any ischemic cardiac history, and he has had no chest pain.  Do not think his pulmonary embolism noticed a blood clot/or cancer, no history of hemoptysis at this point.  I think that this is a primary arrhythmia in nature, we will cope cosign this individual, and he will ultimately need to be admitted to the hospital if he does not fact have a new onset atrial rhythm, and his CHA2DS2-VASc or is 2, he would need consideration for additional therapies and blood thinner use.  Of note, patient takes a beta-blocker, which hhe states that is for blood  pressure  Patient's metabolic panel shows a slightly low calcium  8.8, albumin 3.3, creatinine is 1.26 which is up from baseline but does not appear to be an AKI, magnesium fine at 2.0, normal hematologic panel and we are showing a thyroid  panel.   Cardiology is seeing the patient, recommending administration of Eliquis  and spironolactone  which will be started in the outpatient setting, did not feel that he meets admission criteria at this point they are going to follow him up closely in clinic.   Patient received his initial dose of Eliquis  and spironolactone , heparin  prescription, after talking to cardiology on messenger with my attending, we do not feel this patient requires any troponin level at this point.  Will discharge the patient at this time he is not in atrial fibrillation with RVR his heart rate is in the 80s and has been most of the entire time he has been here, has never been hemodynamically stable, and will be followed up in the atrial fibrillation clinic very shortly   Clinical Course as of 01/03/24 1925  Fri Jan 03, 2024  1524 Htn, hypothyroidism, diverticulitis [JL]    Clinical Course User Index [JL] Gaetano Pac, MD    1. Atrial fibrillation, unspecified type (HCC)     @DISPOSITION @  Rx / DC Orders ED Discharge Orders          Ordered    apixaban  (ELIQUIS ) 5 MG TABS tablet  2 times daily        01/03/24 1906    spironolactone  (ALDACTONE ) 25 MG tablet  Daily        01/03/24 1906             Past Medical History:  Diagnosis Date   Carpal tunnel syndrome    on right pt says surgery 12-24-11 never occurred   Depression    Diverticulitis    Gout    History of kidney stones    none in years   Hyperlipidemia    Hypertension    Past Surgical History:  Procedure Laterality Date   ANKLE RECONSTRUCTION Bilateral 1996   COLONOSCOPY WITH PROPOFOL  N/A 07/27/2014   Procedure: COLONOSCOPY WITH PROPOFOL ;  Surgeon: Gladis MARLA Louder, MD;  Location: [facility];   Service: Endoscopy;  Laterality: N/A;   ELBOW BURSA SURGERY Right    SHOULDER ARTHROSCOPY Bilateral 1980   rotator cuff repair   Family History  Problem Relation Age of Onset   Dementia Mother    AAA (abdominal aortic aneurysm) Father    Dementia Father    AAA (abdominal aortic aneurysm) Sister    Atrial fibrillation Sister    Cervical cancer Sister    Anesthesia problems Neg Hx    Lung disease Neg Hx    Social History   Socioeconomic History   Marital status: Married    Spouse name: Not on file   Number of children: 2   Years of education: Not on file   Highest education level: Not on file  Occupational History   Not on file  Tobacco Use   Smoking status: Never   Smokeless tobacco: Never  Vaping Use   Vaping status: Never Used  Substance and Sexual Activity   Alcohol use: Yes    Alcohol/week: 7.0 standard drinks of alcohol    Types: 7 Glasses of wine per week    Comment: 1 bootle of wine per week   Drug use: No   Sexual activity: Not on file  Other Topics Concern   Not on file  Social History Narrative   Not on file   Social Drivers of Health   Financial Resource Strain: Not on file  Food Insecurity: Not on file  Transportation Needs: Not on file  Physical Activity: Not on file  Stress: Not on file  Social Connections: Not on file  Intimate Partner Violence: Not on file     Physical Exam   Vitals:   01/03/24 1426 01/03/24 1427 01/03/24 1430 01/03/24 1830  BP:    (!) 164/114  Pulse:  83  91  Resp:  17  17  Temp: 98.2 F (36.8 C)     TempSrc: Oral     SpO2:  99%  99%  Weight:   104.3 kg   Height:   5' 8 (1.727 m)     Physical Exam Vitals and nursing note reviewed.  Constitutional:      General: He is not in acute distress.    Appearance: Normal appearance. He is well-developed. He is not ill-appearing or toxic-appearing.  HENT:     Head: Normocephalic and atraumatic.     Right Ear: External ear normal.     Left Ear: External ear normal.      Nose: Nose normal.     Mouth/Throat:     Mouth: Mucous membranes are moist.  Eyes:     Extraocular Movements: Extraocular movements intact.     Pupils: Pupils are equal, round, and reactive  to light.  Cardiovascular:     Rate and Rhythm: Normal rate.     Pulses: Normal pulses.  Pulmonary:     Effort: Pulmonary effort is normal. No respiratory distress.     Breath sounds: Normal breath sounds. No stridor. No wheezing, rhonchi or rales.  Abdominal:     Palpations: Abdomen is soft.     Tenderness: There is no abdominal tenderness. There is no right CVA tenderness or left CVA tenderness.  Musculoskeletal:        General: Normal range of motion.     Cervical back: Normal range of motion and neck supple.  Skin:    General: Skin is warm and dry.     Capillary Refill: Capillary refill takes less than 2 seconds.  Neurological:     General: No focal deficit present.     Mental Status: He is alert and oriented to person, place, and time. Mental status is at baseline.  Psychiatric:        Mood and Affect: Mood normal.      Procedures   If procedures were preformed on this patient, they are listed below:  Procedures  The patient was seen, evaluated, and treated in conjunction with the attending physician, who voiced agreement in the care provided.  Note generated using Dragon voice dictation software and may contain dictation errors. Please contact me for any clarification or with any questions.   Electronically signed by:  Fairy Kerby Revere, M.D. (PGY-2)    Revere Fairy, MD 01/03/24 1726    Revere Fairy, MD 01/03/24 NANCI    Dasie Faden, MD 01/04/24 1539

## 2024-01-12 NOTE — Telephone Encounter (Signed)
Appointment made for follow up

## 2024-01-20 ENCOUNTER — Other Ambulatory Visit (HOSPITAL_COMMUNITY): Payer: Self-pay | Admitting: Internal Medicine

## 2024-01-20 ENCOUNTER — Encounter (HOSPITAL_COMMUNITY): Payer: Self-pay | Admitting: Internal Medicine

## 2024-01-20 ENCOUNTER — Ambulatory Visit (HOSPITAL_COMMUNITY)
Admission: RE | Admit: 2024-01-20 | Discharge: 2024-01-20 | Disposition: A | Discharge status: Home / Self Care | Payer: Medicare Other | Source: Ambulatory Visit | Attending: Internal Medicine | Admitting: Internal Medicine

## 2024-01-20 VITALS — BP 136/94 | HR 83 | Ht 68.0 in | Wt 230.0 lb

## 2024-01-20 DIAGNOSIS — I4891 Unspecified atrial fibrillation: Secondary | ICD-10-CM

## 2024-01-20 DIAGNOSIS — M109 Gout, unspecified: Secondary | ICD-10-CM | POA: Insufficient documentation

## 2024-01-20 DIAGNOSIS — R0683 Snoring: Secondary | ICD-10-CM

## 2024-01-20 DIAGNOSIS — Z87891 Personal history of nicotine dependence: Secondary | ICD-10-CM | POA: Insufficient documentation

## 2024-01-20 DIAGNOSIS — E785 Hyperlipidemia, unspecified: Secondary | ICD-10-CM | POA: Insufficient documentation

## 2024-01-20 DIAGNOSIS — Z87442 Personal history of urinary calculi: Secondary | ICD-10-CM | POA: Insufficient documentation

## 2024-01-20 DIAGNOSIS — D6869 Other thrombophilia: Secondary | ICD-10-CM | POA: Insufficient documentation

## 2024-01-20 DIAGNOSIS — I4819 Other persistent atrial fibrillation: Secondary | ICD-10-CM | POA: Diagnosis not present

## 2024-01-20 DIAGNOSIS — I10 Essential (primary) hypertension: Secondary | ICD-10-CM | POA: Diagnosis not present

## 2024-01-20 DIAGNOSIS — R9431 Abnormal electrocardiogram [ECG] [EKG]: Secondary | ICD-10-CM | POA: Diagnosis not present

## 2024-01-20 DIAGNOSIS — Z7901 Long term (current) use of anticoagulants: Secondary | ICD-10-CM | POA: Diagnosis not present

## 2024-01-20 LAB — BASIC METABOLIC PANEL
Anion gap: 12 (ref 5–15)
BUN: 15 mg/dL (ref 8–23)
CO2: 22 mmol/L (ref 22–32)
Calcium: 9.4 mg/dL (ref 8.9–10.3)
Chloride: 103 mmol/L (ref 98–111)
Creatinine, Ser: 1.37 mg/dL — ABNORMAL HIGH (ref 0.61–1.24)
GFR, Estimated: 54 mL/min — ABNORMAL LOW (ref >60)
Glucose, Bld: 109 mg/dL — ABNORMAL HIGH (ref 70–99)
Potassium: 4.4 mmol/L (ref 3.5–5.1)
Sodium: 137 mmol/L (ref 135–145)

## 2024-01-20 MED ORDER — APIXABAN 5 MG PO TABS: 5.0000 mg | ORAL_TABLET | Freq: Two times a day (BID) | ORAL | 3 refills | Status: DC

## 2024-01-20 NOTE — Progress Notes (Signed)
Primary Care Physician: Renford Dills, MD Primary Cardiologist: None Electrophysiologist: None     Referring Physician: Dr. Criss Rosales Michael Melendez is a 73 y.o. male with a history of HTN, CAC score 173 (07/16/23), history of kidney stones, gout, HLD, prior tobacco use, and paroxysmal atrial fibrillation who presents for consultation in the [facility]. ED visit on 01/03/24 due to smart watch alerting patient to Afib; no cardiac awareness. He appears to have been in atrial flutter. Patient is on Eliquis 5 mg BID for a CHADS2VASC score of 3.  On evaluation today, he is currently in Afib. He does not have cardiac awareness. He started Eliquis with two full doses Monday after ED visit. He has noted previously to ED some brief intermittent chest pain with and without exertion. History of stress test showing mild ischemia. He wears a watch for monitoring. He notes to have had a sleep study years ago.  Today, he denies symptoms of palpitations, shortness of breath, orthopnea, PND, lower extremity edema, dizziness, presyncope, syncope, snoring, daytime somnolence, bleeding, or neurologic sequela. The patient is tolerating medications without difficulties and is otherwise without complaint today.    Atrial Fibrillation Risk Factors:  he does have symptoms or diagnosis of sleep apnea.   he has a BMI of Body mass index is 34.97 kg/m.Marland Kitchen Filed Weights   01/20/24 0902  Weight: 104.3 kg    Current Outpatient Medications  Medication Sig Dispense Refill   acebutolol (SECTRAL) 200 MG capsule TAKE 1 CAPSULE TWICE DAILY 180 capsule 1   albuterol (VENTOLIN HFA) 108 (90 Base) MCG/ACT inhaler Inhale 2 puffs into the lungs every 6 (six) hours as needed. 18 g 5   allopurinol (ZYLOPRIM) 300 MG tablet Take 300 mg by mouth daily.      apixaban (ELIQUIS) 5 MG TABS tablet Take 1 tablet (5 mg total) by mouth 2 (two) times daily. 60 tablet 0   atorvastatin (LIPITOR) 10 MG tablet  Take 10 mg by mouth See admin instructions. Take 1 tablet by mouth 3 times weekly     eszopiclone (LUNESTA) 1 MG TABS tablet Take 1 mg by mouth at bedtime.     levothyroxine (SYNTHROID) 100 MCG tablet Take 1 tablet by mouth daily.     Multiple Vitamin (MULTIVITAMIN WITH MINERALS) TABS tablet Take 1 tablet by mouth daily.     omeprazole (PRILOSEC) 40 MG capsule Take 40 mg by mouth 2 (two) times daily before a meal.     spironolactone (ALDACTONE) 25 MG tablet Take 1 tablet (25 mg total) by mouth daily. 30 tablet 0   testosterone cypionate (DEPOTESTOSTERONE CYPIONATE) 200 MG/ML injection Inject 1.5 mLs into the muscle See admin instructions. Every 3 weeks     No current facility-administered medications for this encounter.    Atrial Fibrillation Management history:  Previous antiarrhythmic drugs: none Previous cardioversions: none Previous ablations: none Anticoagulation history: Eliquis   ROS- All systems are reviewed and negative except as per the HPI above.  Physical Exam: BP (!) 136/94   Pulse 83   Ht 5\' 8"  (1.727 m)   Wt 104.3 kg   BMI 34.97 kg/m   GEN: Well nourished, well developed in no acute distress NECK: No JVD; No carotid bruits CARDIAC: Irregularly irregular rate and rhythm, no murmurs, rubs, gallops RESPIRATORY:  Clear to auscultation without rales, wheezing or rhonchi  ABDOMEN: Soft, non-tender, non-distended EXTREMITIES:  No edema; No deformity   EKG today demonstrates  Vent. rate  83 BPM PR interval * ms QRS duration 94 ms QT/QTcB 404/474 ms P-R-T axes * 85 -14 Atrial fibrillation Incomplete right bundle branch block Anteroseptal infarct , age undetermined Abnormal ECG When compared with ECG of 03-Jan-2024 16:37, PREVIOUS ECG IS PRESENT  Echo 11/18/2019 demonstrated  Moderate concentric hypertrophy of the left ventricle. Left ventricle  cavity is normal in size. Normal global wall motion. Normal LV systolic  function with EF 55%. Doppler evidence of  grade I (impaired) diastolic  dysfunction, normal LAP.  Left atrial cavity is mildly dilated.  Mild (Grade I) mitral regurgitation.  Mild tricuspid regurgitation. Estimated pulmonary artery systolic pressure  is 24 mmHg.   ASSESSMENT & PLAN CHA2DS2-VASc Score = 3  The patient's score is based upon: CHF History: 0 HTN History: 1 Diabetes History: 0 Stroke History: 0 Vascular Disease History: 1 Age Score: 1 Gender Score: 0       ASSESSMENT AND PLAN: Persistent Atrial Fibrillation (ICD10:  I48.19) The patient's CHA2DS2-VASc score is 3, indicating a 3.2% annual risk of stroke.    He is currently in Afib. He notes that via Apple watch he has remained in Afib since ED visit on 1/10. We discussed options for treatment and he is in agreement to proceed with cardioversion (discussed previously in ED visit with Dr. Jacinto Halim). He understands there is no guarantee of return of NSR but this is reasonable as an initial step to try to convert him to NSR. Bmet drawn today; CBC drawn on 1/10. I will help refer patient for sleep study. I will help patient re establish care with Dr. Jacinto Halim as cardiologist noted in consult note that patient may benefit from additional testing to evaluate for ischemia.   Informed Consent   Shared Decision Making/Informed Consent The risks (stroke, cardiac arrhythmias rarely resulting in the need for a temporary or permanent pacemaker, skin irritation or burns and complications associated with conscious sedation including aspiration, arrhythmia, respiratory failure and death), benefits (restoration of normal sinus rhythm) and alternatives of a direct current cardioversion were explained in detail to Mr. [name] and he agrees to proceed.      Secondary Hypercoagulable State (ICD10:  D68.69) The patient is at significant risk for stroke/thromboembolism based upon his CHA2DS2-VASc Score of 3.  Continue Apixaban (Eliquis).  He has not missed doses of Eliquis.    Follow up 2  weeks after DCCV.   [name], PA-C  [facility] [address] [place], [place] [address] [phone]

## 2024-01-20 NOTE — Patient Instructions (Addendum)
Cardioversion scheduled for: February. 5th 2025   - Arrive at the [facility] and go to admitting at 10:00am   - Do not eat or drink anything after midnight the night prior to your procedure.   - Take all your morning medication (except diabetic medications) with a sip of water prior to arrival.  - You will not be able to drive home after your procedure.    - Do NOT miss any doses of your blood thinner - if you should miss a dose please notify our office immediately.   - If you feel as if you go back into normal rhythm prior to scheduled cardioversion, please notify our office immediately.   If your procedure is canceled in the cardioversion suite you will be charged a cancellation fee.

## 2024-01-21 ENCOUNTER — Other Ambulatory Visit (HOSPITAL_COMMUNITY): Payer: Self-pay

## 2024-01-28 ENCOUNTER — Telehealth (HOSPITAL_COMMUNITY): Payer: Self-pay

## 2024-01-28 NOTE — Telephone Encounter (Signed)
Patient is scheduled for his sleep Consultation at [facility] on 2/17 @ 8:00 am.

## 2024-01-28 NOTE — OR Nursing (Signed)
 Called patient with pre-procedure instructions for tomorrow.   Patient informed of:   Time to arrive for procedure. 0930 Remain NPO past midnight.  Must have a ride home and a responsible adult to remain with them for 24 hours post procedure.  Confirmed blood thinner. Confirmed no breaks in taking blood thinner for 3+ weeks prior to procedure. Confirmed patient stopped all GLP-1s and GLP-2s for at least one week before procedure.

## 2024-01-29 ENCOUNTER — Ambulatory Visit (HOSPITAL_COMMUNITY): Payer: Medicare Other

## 2024-01-29 ENCOUNTER — Ambulatory Visit (HOSPITAL_COMMUNITY)
Admission: RE | Admit: 2024-01-29 | Discharge: 2024-01-29 | Disposition: A | Discharge status: Home / Self Care | Payer: Medicare Other | Attending: Cardiology | Admitting: Cardiology

## 2024-01-29 ENCOUNTER — Encounter (HOSPITAL_COMMUNITY)
Admission: RE | Disposition: A | Discharge status: Home / Self Care | Payer: Self-pay | Source: Home / Self Care | Attending: Cardiology

## 2024-01-29 ENCOUNTER — Other Ambulatory Visit: Payer: Self-pay

## 2024-01-29 DIAGNOSIS — I4891 Unspecified atrial fibrillation: Secondary | ICD-10-CM

## 2024-01-29 DIAGNOSIS — E785 Hyperlipidemia, unspecified: Secondary | ICD-10-CM | POA: Insufficient documentation

## 2024-01-29 DIAGNOSIS — M109 Gout, unspecified: Secondary | ICD-10-CM | POA: Diagnosis not present

## 2024-01-29 DIAGNOSIS — I10 Essential (primary) hypertension: Secondary | ICD-10-CM | POA: Insufficient documentation

## 2024-01-29 DIAGNOSIS — Z7901 Long term (current) use of anticoagulants: Secondary | ICD-10-CM | POA: Diagnosis not present

## 2024-01-29 DIAGNOSIS — Z87891 Personal history of nicotine dependence: Secondary | ICD-10-CM | POA: Diagnosis not present

## 2024-01-29 DIAGNOSIS — D6869 Other thrombophilia: Secondary | ICD-10-CM | POA: Insufficient documentation

## 2024-01-29 DIAGNOSIS — I4819 Other persistent atrial fibrillation: Secondary | ICD-10-CM | POA: Diagnosis present

## 2024-01-29 HISTORY — PX: CARDIOVERSION: EP1203

## 2024-01-29 SURGERY — CARDIOVERSION (CATH LAB)
Anesthesia: General

## 2024-01-29 MED ORDER — LIDOCAINE 2% (20 MG/ML) 5 ML SYRINGE
INTRAMUSCULAR | Status: DC | PRN
  Administered 2024-01-29: 60 mg via INTRAVENOUS

## 2024-01-29 MED ORDER — PROPOFOL 10 MG/ML IV BOLUS
INTRAVENOUS | Status: DC | PRN
  Administered 2024-01-29: 50 mg via INTRAVENOUS
  Administered 2024-01-29: 10 mg via INTRAVENOUS

## 2024-01-29 MED ORDER — SODIUM CHLORIDE 0.9% FLUSH
3.0000 mL | Freq: Two times a day (BID) | INTRAVENOUS | Status: DC
  Administered 2024-01-29: 10 mL via INTRAVENOUS

## 2024-01-29 MED ORDER — SODIUM CHLORIDE 0.9% FLUSH: 3.0000 mL | INTRAVENOUS | Status: DC | PRN

## 2024-01-29 SURGICAL SUPPLY — 1 items: PAD DEFIB RADIO PHYSIO CONN (PAD) ×2 IMPLANT

## 2024-01-29 NOTE — Anesthesia Preprocedure Evaluation (Signed)
 Anesthesia Evaluation  Patient identified by MRN, date of birth, ID band Patient awake    Reviewed: Allergy  & Precautions, H&P , NPO status , Patient's Chart, lab work & pertinent test results  Airway Mallampati: II  TM Distance: >3 FB Neck ROM: Full    Dental no notable dental hx.    Pulmonary neg pulmonary ROS   Pulmonary exam normal breath sounds clear to auscultation       Cardiovascular hypertension, Normal cardiovascular exam+ dysrhythmias Atrial Fibrillation  Rhythm:Regular Rate:Normal     Neuro/Psych  PSYCHIATRIC DISORDERS Anxiety Depression    negative neurological ROS     GI/Hepatic negative GI ROS, Neg liver ROS,,,  Endo/Other  negative endocrine ROS    Renal/GU negative Renal ROS  negative genitourinary   Musculoskeletal negative musculoskeletal ROS (+)    Abdominal   Peds negative pediatric ROS (+)  Hematology negative hematology ROS (+)   Anesthesia Other Findings   Reproductive/Obstetrics negative OB ROS                              Anesthesia Physical Anesthesia Plan  ASA: 2  Anesthesia Plan: General   Post-op Pain Management:    Induction: Intravenous  PONV Risk Score and Plan: 1 and Treatment may vary due to age or medical condition  Airway Management Planned: Nasal Cannula and Natural Airway  Additional Equipment:   Intra-op Plan:   Post-operative Plan:   Informed Consent: I have reviewed the patients History and Physical, chart, labs and discussed the procedure including the risks, benefits and alternatives for the proposed anesthesia with the patient or authorized representative who has indicated his/her understanding and acceptance.     Dental advisory given  Plan Discussed with: CRNA  Anesthesia Plan Comments:          Anesthesia Quick Evaluation

## 2024-01-29 NOTE — Interval H&P Note (Signed)
 History and Physical Interval Note:  01/29/2024 9:48 AM  Michael Melendez  has presented today for surgery, with the diagnosis of AFIB.  The various methods of treatment have been discussed with the patient and family. After consideration of risks, benefits and other options for treatment, the patient has consented to  Procedure(s): CARDIOVERSION (N/A) as a surgical intervention.  The patient's history has been reviewed, patient examined, no change in status, stable for surgery.  I have reviewed the patient's chart and labs.  Questions were answered to the patient's satisfaction.     Wilbert Bihari

## 2024-01-29 NOTE — Interval H&P Note (Signed)
 History and Physical Interval Note:  01/29/2024 10:17 AM  Michael Melendez  has presented today for surgery, with the diagnosis of AFIB.  The various methods of treatment have been discussed with the patient and family. After consideration of risks, benefits and other options for treatment, the patient has consented to  Procedure(s): CARDIOVERSION (N/A) as a surgical intervention.  The patient's history has been reviewed, patient examined, no change in status, stable for surgery.  I have reviewed the patient's chart and labs.  Questions were answered to the patient's satisfaction.     Wilbert Bihari

## 2024-01-29 NOTE — Transfer of Care (Signed)
 Immediate Anesthesia Transfer of Care Note  Patient: Michael Melendez  Procedure(s) Performed: CARDIOVERSION  Patient Location: PACU  Anesthesia Type:General  Level of Consciousness: drowsy  Airway & Oxygen Therapy: Patient Spontanous Breathing and Patient connected to face mask oxygen  Post-op Assessment: Report given to RN and Post -op Vital signs reviewed and stable  Post vital signs: Reviewed and stable  Last Vitals:  Vitals Value Taken Time  BP    Temp    Pulse    Resp    SpO2      Last Pain:  Vitals:   01/29/24 0930  TempSrc: Temporal  PainSc: 0-No pain         Complications: No notable events documented.

## 2024-01-29 NOTE — Anesthesia Postprocedure Evaluation (Signed)
 Anesthesia Post Note  Patient: Michael Melendez  Procedure(s) Performed: CARDIOVERSION     Patient location during evaluation: PACU Anesthesia Type: General Level of consciousness: awake and alert Pain management: pain level controlled Vital Signs Assessment: post-procedure vital signs reviewed and stable Respiratory status: spontaneous breathing, nonlabored ventilation, respiratory function stable and patient connected to nasal cannula oxygen Cardiovascular status: blood pressure returned to baseline and stable Postop Assessment: no apparent nausea or vomiting Anesthetic complications: no   No notable events documented.  Last Vitals:  Vitals:   01/29/24 0930 01/29/24 1035  BP: (!) 131/92 121/83  Pulse: 64 62  Resp: (!) 21 18  Temp: 36.7 C 36.7 C  SpO2: 97% 98%    Last Pain:  Vitals:   01/29/24 1035  TempSrc: Temporal  PainSc: 0-No pain                 [name]

## 2024-01-29 NOTE — CV Procedure (Signed)
    Electrical Cardioversion Procedure Note Michael Melendez [id number] [birth year]  Procedure: Electrical Cardioversion Indications:  Atrial Fibrillation  Time Out: Verified patient identification, verified procedure,medications/allergies/relevent history reviewed, required imaging and test results available.  Performed  Procedure Details  During this procedure the patient is administered a total of lidocaine  60mg  and Propofol  60 mg to achieve and maintain moderate conscious sedation.  The patient's heart rate, blood pressure, and oxygen saturation are monitored continuously during the procedure. The period of conscious sedation is 1 minutes, of which I was present face-to-face 100% of this time. Michael Melendez, CRN is an independent, trained observer who assisted in the monitoring of the patient's level of consciousness.     Cardioversion was done with synchronized biphasic defibrillation with AP pads with 200watts.  The patient converted to normal sinus rhythm. The patient tolerated the procedure well   IMPRESSION:  Successful cardioversion of atrial fibrillation    Michael Melendez 01/29/2024, 9:48 AM

## 2024-01-30 ENCOUNTER — Encounter (HOSPITAL_COMMUNITY): Payer: Self-pay | Admitting: Cardiology

## 2024-02-01 ENCOUNTER — Telehealth: Payer: Self-pay | Admitting: Cardiology

## 2024-02-01 NOTE — Telephone Encounter (Signed)
 Outpatient service line:  Patient with recent successful DCCV to NSR.  Reportedly on his Apple Watch was noted to be back in atrial fibrillation today, asymptomatic with controlled ventricular rates less than 100.  Discussed need for ongoing anticoagulation and ER precautions if he had hypotension, tachyarrhythmia greater than 120+ sustained.  For the time being I think we can just see him outpatient again soon and get an EKG and verify rhythm.  Please call patient to perhaps reschedule for sooner appointment.

## 2024-02-05 ENCOUNTER — Ambulatory Visit (HOSPITAL_COMMUNITY)
Admission: RE | Admit: 2024-02-05 | Discharge: 2024-02-05 | Discharge status: Home / Self Care | Payer: Medicare Other | Source: Ambulatory Visit | Attending: Internal Medicine | Admitting: Internal Medicine

## 2024-02-05 VITALS — BP 122/84 | HR 64 | Ht 68.0 in | Wt 229.4 lb

## 2024-02-05 DIAGNOSIS — D6869 Other thrombophilia: Secondary | ICD-10-CM | POA: Insufficient documentation

## 2024-02-05 DIAGNOSIS — M109 Gout, unspecified: Secondary | ICD-10-CM | POA: Diagnosis not present

## 2024-02-05 DIAGNOSIS — E785 Hyperlipidemia, unspecified: Secondary | ICD-10-CM | POA: Insufficient documentation

## 2024-02-05 DIAGNOSIS — I10 Essential (primary) hypertension: Secondary | ICD-10-CM | POA: Diagnosis not present

## 2024-02-05 DIAGNOSIS — Z7901 Long term (current) use of anticoagulants: Secondary | ICD-10-CM | POA: Diagnosis not present

## 2024-02-05 DIAGNOSIS — Z87442 Personal history of urinary calculi: Secondary | ICD-10-CM | POA: Diagnosis not present

## 2024-02-05 DIAGNOSIS — Z87891 Personal history of nicotine dependence: Secondary | ICD-10-CM | POA: Insufficient documentation

## 2024-02-05 DIAGNOSIS — I480 Paroxysmal atrial fibrillation: Secondary | ICD-10-CM | POA: Diagnosis not present

## 2024-02-05 DIAGNOSIS — I4819 Other persistent atrial fibrillation: Secondary | ICD-10-CM | POA: Insufficient documentation

## 2024-02-05 NOTE — Progress Notes (Signed)
Primary Care Physician: Renford Dills, MD Primary Cardiologist: None Electrophysiologist: None     Referring Physician: Dr. Criss Rosales Michael Melendez is a 73 y.o. male with a history of HTN, CAC score 173 (07/16/23), history of kidney stones, gout, HLD, prior tobacco use, and paroxysmal atrial fibrillation who presents for consultation in the [facility]. ED visit on 01/03/24 due to smart watch alerting patient to Afib; no cardiac awareness. He appears to have been in atrial flutter. Patient is on Eliquis 5 mg BID for a CHADS2VASC score of 3.  On evaluation today, he is currently in Afib. He does not have cardiac awareness. He started Eliquis with two full doses Monday after ED visit. He has noted previously to ED some brief intermittent chest pain with and without exertion. History of stress test showing mild ischemia. He wears a watch for monitoring. He notes to have had a sleep study years ago.  On follow up 02/05/24, he is currently in Afib. S/p successful DCCV on 01/29/24. Patient called office on 2/8 noting ERAF. He had wine the night before and watch alerted him the next day. No missed doses of Eliquis.   Today, he denies symptoms of palpitations, shortness of breath, orthopnea, PND, lower extremity edema, dizziness, presyncope, syncope, snoring, daytime somnolence, bleeding, or neurologic sequela. The patient is tolerating medications without difficulties and is otherwise without complaint today.    Atrial Fibrillation Risk Factors:  he does have symptoms or diagnosis of sleep apnea.   he has a BMI of Body mass index is 34.88 kg/m.Marland Kitchen Filed Weights   02/05/24 0826  Weight: 104.1 kg     Current Outpatient Medications  Medication Sig Dispense Refill   acebutolol (SECTRAL) 200 MG capsule TAKE 1 CAPSULE TWICE DAILY 180 capsule 1   albuterol (VENTOLIN HFA) 108 (90 Base) MCG/ACT inhaler Inhale 2 puffs into the lungs every 6 (six) hours as needed. 18 g 5    allopurinol (ZYLOPRIM) 300 MG tablet Take 300 mg by mouth daily.      apixaban (ELIQUIS) 5 MG TABS tablet Take 1 tablet (5 mg total) by mouth 2 (two) times daily. 60 tablet 3   atorvastatin (LIPITOR) 10 MG tablet Take 10 mg by mouth See admin instructions. Take 1 tablet by mouth 3 times weekly     eszopiclone (LUNESTA) 1 MG TABS tablet Take 1 mg by mouth at bedtime.     levothyroxine (SYNTHROID) 100 MCG tablet Take 100 mcg by mouth daily before breakfast.     magnesium oxide (MAG-OX) 400 (240 Mg) MG tablet Take 800 mg by mouth at bedtime.     Multiple Vitamin (MULTIVITAMIN WITH MINERALS) TABS tablet Take 1 tablet by mouth daily.     omeprazole (PRILOSEC) 40 MG capsule Take 40 mg by mouth 2 (two) times daily before a meal.     spironolactone (ALDACTONE) 25 MG tablet Take 1 tablet (25 mg total) by mouth daily. 30 tablet 0   testosterone cypionate (DEPOTESTOSTERONE CYPIONATE) 200 MG/ML injection Inject 1.5 mLs into the muscle See admin instructions. Every 3 weeks     No current facility-administered medications for this encounter.    Atrial Fibrillation Management history:  Previous antiarrhythmic drugs: none Previous cardioversions: 01/29/24 Previous ablations: none Anticoagulation history: Eliquis   ROS- All systems are reviewed and negative except as per the HPI above.  Physical Exam: BP 122/84   Pulse 64   Ht 5\' 8"  (1.727 m)   Wt 104.1  kg   BMI 34.88 kg/m   GEN- The patient is well appearing, alert and oriented x 3 today.   Neck - no JVD or carotid bruit noted Lungs- Clear to ausculation bilaterally, normal work of breathing Heart- Irregular rate and rhythm, no murmurs, rubs or gallops, PMI not laterally displaced Extremities- no clubbing, cyanosis, or edema Skin - no rash or ecchymosis noted   EKG today demonstrates  Vent. rate 64 BPM PR interval * ms QRS duration 88 ms QT/QTcB 422/435 ms P-R-T axes * 80 -5 Atrial fibrillation Anteroseptal infarct , age  undetermined Abnormal ECG When compared with ECG of 29-Jan-2024 10:29, PREVIOUS ECG IS PRESENT  Echo 11/18/2019 demonstrated  Moderate concentric hypertrophy of the left ventricle. Left ventricle  cavity is normal in size. Normal global wall motion. Normal LV systolic  function with EF 55%. Doppler evidence of grade I (impaired) diastolic  dysfunction, normal LAP.  Left atrial cavity is mildly dilated.  Mild (Grade I) mitral regurgitation.  Mild tricuspid regurgitation. Estimated pulmonary artery systolic pressure  is 24 mmHg.   ASSESSMENT & PLAN CHA2DS2-VASc Score = 3  The patient's score is based upon: CHF History: 0 HTN History: 1 Diabetes History: 0 Stroke History: 0 Vascular Disease History: 1 Age Score: 1 Gender Score: 0       ASSESSMENT AND PLAN: Persistent Atrial Fibrillation (ICD10:  I48.19) The patient's CHA2DS2-VASc score is 3, indicating a 3.2% annual risk of stroke.   S/p successful DCCV on 01/29/24.  He is currently in Afib. He has had ERAF after cardioversion. We had a long discussion about medication treatments and ablation in detail. We discussed potential options such as Multaq, Tikosyn, and amiodarone. We talked about the monitoring required for these medications, hospital admission for Tikosyn, and potential adverse effects. We also discussed pulse field ablation as an option in the form of intervention. After discussion, patient is interested in speaking with EP regarding potential for Afib ablation. He will call clinic back with medication decision after price checking options.  Secondary Hypercoagulable State (ICD10:  D68.69) The patient is at significant risk for stroke/thromboembolism based upon his CHA2DS2-VASc Score of 3.  Continue Apixaban (Eliquis).  Continue without interruption.    Refer to EP to discuss ablation. Patient will call clinic with medication decision.    [name], PA-C  Afib [facility] [address] [place], [place] [address] [phone]

## 2024-02-06 ENCOUNTER — Encounter: Payer: Self-pay | Admitting: Cardiology

## 2024-02-06 ENCOUNTER — Ambulatory Visit: Payer: Medicare Other | Attending: Cardiology | Admitting: Cardiology

## 2024-02-06 ENCOUNTER — Encounter: Payer: Self-pay | Admitting: *Deleted

## 2024-02-06 VITALS — BP 140/82 | HR 78 | Ht 68.0 in | Wt 223.0 lb

## 2024-02-06 DIAGNOSIS — I4819 Other persistent atrial fibrillation: Secondary | ICD-10-CM

## 2024-02-06 DIAGNOSIS — I10 Essential (primary) hypertension: Secondary | ICD-10-CM | POA: Diagnosis not present

## 2024-02-06 DIAGNOSIS — D6869 Other thrombophilia: Secondary | ICD-10-CM | POA: Diagnosis not present

## 2024-02-06 DIAGNOSIS — Z01812 Encounter for preprocedural laboratory examination: Secondary | ICD-10-CM

## 2024-02-06 NOTE — Patient Instructions (Addendum)
Medication Instructions:  Your physician recommends that you continue on your current medications as directed. Please refer to the Current Medication list given to you today.  *If you need a refill on your cardiac medications before your next appointment, please call your pharmacy*   Lab Work: Pre procedure labs -- we will call you to schedule:  CBC  If you have a lab test that is abnormal and we need to change your treatment, we will call you to review the results -- otherwise no news is good news.    Testing/Procedures: Your physician has recommended that you have an ablation. Catheter ablation is a medical procedure used to treat some cardiac arrhythmias (irregular heartbeats). During catheter ablation, a long, thin, flexible tube is put into a blood vessel in your groin (upper thigh), or neck. This tube is called an ablation catheter. It is then guided to your heart through the blood vessel. Radio frequency waves destroy small areas of heart tissue where abnormal heartbeats may cause an arrhythmia to start.   You will have a TEE prior to this ablation, morning of procedure. Your ablation is scheduled for 02/11/24. Please arrive at [facility] at 8:00 am.  We will call you for further instructions.   Follow-Up: At [facility], you and your health needs are our priority.  As part of our continuing mission to provide you with exceptional heart care, we have created designated Provider Care Teams.  These Care Teams include your primary Cardiologist (physician) and Advanced Practice Providers (APPs -  Physician Assistants and Nurse Practitioners) who all work together to provide you with the care you need, when you need it.  Your next appointment:   1 month(s) after your ablation  The format for your next appointment:   In Person  Provider:   AFib clinic   Thank you for choosing [facility]!!   Dory Horn, RN [phone]    Other Instructions   Cardiac  Ablation Cardiac ablation is a procedure to destroy (ablate) some heart tissue that is sending bad signals. These bad signals cause problems in heart rhythm. The heart has many areas that make these signals. If there are problems in these areas, they can make the heart beat in a way that is not normal. Destroying some tissues can help make the heart rhythm normal. Tell your doctor about: Any allergies you have. All medicines you are taking. These include vitamins, herbs, eye drops, creams, and over-the-counter medicines. Any problems you or family members have had with medicines that make you fall asleep (anesthetics). Any blood disorders you have. Any surgeries you have had. Any medical conditions you have, such as kidney failure. Whether you are pregnant or may be pregnant. What are the risks? This is a safe procedure. But problems may occur, including: Infection. Bruising and bleeding. Bleeding into the chest. Stroke or blood clots. Damage to nearby areas of your body. Allergies to medicines or dyes. The need for a pacemaker if the normal system is damaged. Failure of the procedure to treat the problem. What happens before the procedure? Medicines Ask your doctor about: Changing or stopping your normal medicines. This is important. Taking aspirin and ibuprofen. Do not take these medicines unless your doctor tells you to take them. Taking other medicines, vitamins, herbs, and supplements. General instructions Follow instructions from your doctor about what you cannot eat or drink. Plan to have someone take you home from the hospital or clinic. If you will be going home right  after the procedure, plan to have someone with you for 24 hours. Ask your doctor what steps will be taken to prevent infection. What happens during the procedure?  An IV tube will be put into one of your veins. You will be given a medicine to help you relax. The skin on your neck or groin will be numbed. A  cut (incision) will be made in your neck or groin. A needle will be put through your cut and into a large vein. A tube (catheter) will be put into the needle. The tube will be moved to your heart. Dye may be put through the tube. This helps your doctor see your heart. Small devices (electrodes) on the tube will send out signals. A type of energy will be used to destroy some heart tissue. The tube will be taken out. Pressure will be held on your cut. This helps stop bleeding. A bandage will be put over your cut. The exact procedure may vary among doctors and hospitals. What happens after the procedure? You will be watched until you leave the hospital or clinic. This includes checking your heart rate, breathing rate, oxygen, and blood pressure. Your cut will be watched for bleeding. You will need to lie still for a few hours. Do not drive for 24 hours or as long as your doctor tells you. Summary Cardiac ablation is a procedure to destroy some heart tissue. This is done to treat heart rhythm problems. Tell your doctor about any medical conditions you may have. Tell him or her about all medicines you are taking to treat them. This is a safe procedure. But problems may occur. These include infection, bruising, bleeding, and damage to nearby areas of your body. Follow what your doctor tells you about food and drink. You may also be told to change or stop some of your medicines. After the procedure, do not drive for 24 hours or as long as your doctor tells you. This information is not intended to replace advice given to you by your health care provider. Make sure you discuss any questions you have with your health care provider. Document Revised: 03/02/2022 Document Reviewed: 11/12/2019 Elsevier Patient Education  2023 Elsevier Inc.   Cardiac Ablation, Care After  This sheet gives you information about how to care for yourself after your procedure. Your health care provider may also give you more  specific instructions. If you have problems or questions, contact your health care provider. What can I expect after the procedure? After the procedure, it is common to have: Bruising around your puncture site. Tenderness around your puncture site. Skipped heartbeats. If you had an atrial fibrillation ablation, you may have atrial fibrillation during the first several months after your procedure.  Tiredness (fatigue).  Follow these instructions at home: Puncture site care  Follow instructions from your health care provider about how to take care of your puncture site. Make sure you: If present, leave stitches (sutures), skin glue, or adhesive strips in place. These skin closures may need to stay in place for up to 2 weeks. If adhesive strip edges start to loosen and curl up, you may trim the loose edges. Do not remove adhesive strips completely unless your health care provider tells you to do that. If a large square bandage is present, this may be removed 24 hours after surgery.  Check your puncture site every day for signs of infection. Check for: Redness, swelling, or pain. Fluid or blood. If your puncture site starts to bleed,  lie down on your back, apply firm pressure to the area, and contact your health care provider. Warmth. Pus or a bad smell. A pea or small marble sized lump at the site is normal and can take up to three months to resolve.  Driving Do not drive for at least 4 days after your procedure or however long your health care provider recommends. (Do not resume driving if you have previously been instructed not to drive for other health reasons.) Do not drive or use heavy machinery while taking prescription pain medicine. Activity Avoid activities that take a lot of effort for at least 7 days after your procedure. Do not lift anything that is heavier than 5 lb (4.5 kg) for one week.  No sexual activity for 1 week.  Return to your normal activities as told by your health care  provider. Ask your health care provider what activities are safe for you. General instructions Take over-the-counter and prescription medicines only as told by your health care provider. Do not use any products that contain nicotine or tobacco, such as cigarettes and e-cigarettes. If you need help quitting, ask your health care provider. You may shower after 24 hours, but Do not take baths, swim, or use a hot tub for 1 week.  Do not drink alcohol for 24 hours after your procedure. Keep all follow-up visits as told by your health care provider. This is important. Contact a health care provider if: You have redness, mild swelling, or pain around your puncture site. You have fluid or blood coming from your puncture site that stops after applying firm pressure to the area. Your puncture site feels warm to the touch. You have pus or a bad smell coming from your puncture site. You have a fever. You have chest pain or discomfort that spreads to your neck, jaw, or arm. You have chest pain that is worse with lying on your back or taking a deep breath. You are sweating a lot. You feel nauseous. You have a fast or irregular heartbeat. You have shortness of breath. You are dizzy or light-headed and feel the need to lie down. You have pain or numbness in the arm or leg closest to your puncture site. Get help right away if: Your puncture site suddenly swells. Your puncture site is bleeding and the bleeding does not stop after applying firm pressure to the area. These symptoms may represent a serious problem that is an emergency. Do not wait to see if the symptoms will go away. Get medical help right away. Call your local emergency services (911 in the [place] Do not drive yourself to the hospital. Summary After the procedure, it is normal to have bruising and tenderness at the puncture site in your groin, neck, or forearm. Check your puncture site every day for signs of infection. Get help right away if  your puncture site is bleeding and the bleeding does not stop after applying firm pressure to the area. This is a medical emergency. This information is not intended to replace advice given to you by your health care provider. Make sure you discuss any questions you have with your health care provider.

## 2024-02-06 NOTE — H&P (View-Only) (Signed)
 Electrophysiology Office Note:   Date:  02/06/2024  ID:  Michael Melendez, DOB 07-19-1951, MRN [id number]  Primary Cardiologist: None Primary Heart Failure: None Electrophysiologist: Antrell Tipler Jorja Loa, MD      History of Present Illness:   Michael Melendez is a 73 y.o. male with h/o hypertension, elevated coronary calcium, hyperlipidemia, tobacco abuse, atrial fibrillation seen today for  for Electrophysiology evaluation of atrial fibrillation at the request of [name].    He presented to the emergency room 01/03/2024 due to an episode of atrial fibrillation on his smart watch.  He was in atrial flutter at the time.  He has now post cardioversion 01/29/2024.  Unfortunately he has gone back into atrial fibrillation.  He is minimally symptomatic unless he works out at [employer]  He notes that his heart rate is quite variable when he exercises, and it is difficult to get his heart rate up.  He feels somewhat fatigued, and when he does strenuous exertion, has felt significant lightheaded and dizzy.  Review of systems complete and found to be negative unless listed in HPI.   EP Information / Studies Reviewed:    EKG is not ordered today. EKG from 02/05/24 reviewed which showed atrial fibrillation        Risk Assessment/Calculations:    CHA2DS2-VASc Score = 3   This indicates a 3.2% annual risk of stroke. The patient's score is based upon: CHF History: 0 HTN History: 1 Diabetes History: 0 Stroke History: 0 Vascular Disease History: 1 Age Score: 1 Gender Score: 0            Physical Exam:   VS:  BP (!) 140/82 (BP Location: Left Arm, Patient Position: Sitting, Cuff Size: Large)   Pulse 78   Ht 5\' 8"  (1.727 m)   Wt 223 lb (101.2 kg)   SpO2 98%   BMI 33.91 kg/m    Wt Readings from Last 3 Encounters:  02/06/24 223 lb (101.2 kg)  02/05/24 229 lb 6.4 oz (104.1 kg)  01/29/24 225 lb (102.1 kg)     GEN: Well nourished, well developed in no acute distress NECK: No JVD; No carotid  bruits CARDIAC: Irregularly irregular rate and rhythm, no murmurs, rubs, gallops RESPIRATORY:  Clear to auscultation without rales, wheezing or rhonchi  ABDOMEN: Soft, non-tender, non-distended EXTREMITIES:  No edema; No deformity   ASSESSMENT AND PLAN:    1.  Persistent atrial fibrillation: Post cardioversion 01/29/2024.  He is unfortunately back in atrial fibrillation.  He would prefer a rhythm control strategy.  He would prefer to avoid long-term medications.  Due to that, we Cherrelle Plante plan for ablation.  Risks and benefits have been discussed.  He understands these risks and is agreed to the procedure.  Risk, benefits, and alternatives to EP study and radiofrequency/pulse field ablation for afib were also discussed in detail today. These risks include but are not limited to stroke, bleeding, vascular damage, tamponade, perforation, damage to the esophagus, lungs, and other structures, pulmonary vein stenosis, worsening renal function, and death. The patient understands these risk and wishes to proceed.  We Leronda Lewers therefore proceed with catheter ablation at the next available time.  Carto, ICE, anesthesia are requested for the procedure.  Hudson Majkowski also obtain CT PV protocol prior to the procedure to exclude LAA thrombus and further evaluate atrial anatomy.  2.  Secondary hypercoagulable state: Currently on Eliquis for atrial fibrillation  3.  Hypertension: Mildly elevated today.  Usually well-controlled.  Plan per primary physician.  Follow  up with Dr. Elberta Fortis as usual post procedure  Signed, Edson Deridder Jorja Loa, MD

## 2024-02-06 NOTE — Progress Notes (Signed)
Electrophysiology Office Note:   Date:  02/06/2024  ID:  Michael Melendez, DOB 07-19-1951, MRN [id number]  Primary Cardiologist: None Primary Heart Failure: None Electrophysiologist: Michael Tipler Jorja Loa, MD      History of Present Illness:   Michael Melendez is a 73 y.o. male with h/o hypertension, elevated coronary calcium, hyperlipidemia, tobacco abuse, atrial fibrillation seen today for  for Electrophysiology evaluation of atrial fibrillation at the request of Michael Melendez.    He presented to the emergency room 01/03/2024 due to an episode of atrial fibrillation on his smart watch.  He was in atrial flutter at the time.  He has now post cardioversion 01/29/2024.  Unfortunately he has gone back into atrial fibrillation.  He is minimally symptomatic unless he works out at [employer]  He notes that his heart rate is quite variable when he exercises, and it is difficult to get his heart rate up.  He feels somewhat fatigued, and when he does strenuous exertion, has felt significant lightheaded and dizzy.  Review of systems complete and found to be negative unless listed in HPI.   EP Information / Studies Reviewed:    EKG is not ordered today. EKG from 02/05/24 reviewed which showed atrial fibrillation        Risk Assessment/Calculations:    CHA2DS2-VASc Score = 3   This indicates a 3.2% annual risk of stroke. The patient's score is based upon: CHF History: 0 HTN History: 1 Diabetes History: 0 Stroke History: 0 Vascular Disease History: 1 Age Score: 1 Gender Score: 0            Physical Exam:   VS:  BP (!) 140/82 (BP Location: Left Arm, Patient Position: Sitting, Cuff Size: Large)   Pulse 78   Ht 5\' 8"  (1.727 m)   Wt 223 lb (101.2 kg)   SpO2 98%   BMI 33.91 kg/m    Wt Readings from Last 3 Encounters:  02/06/24 223 lb (101.2 kg)  02/05/24 229 lb 6.4 oz (104.1 kg)  01/29/24 225 lb (102.1 kg)     GEN: Well nourished, well developed in no acute distress NECK: No JVD; No carotid  bruits CARDIAC: Irregularly irregular rate and rhythm, no murmurs, rubs, gallops RESPIRATORY:  Clear to auscultation without rales, wheezing or rhonchi  ABDOMEN: Soft, non-tender, non-distended EXTREMITIES:  No edema; No deformity   ASSESSMENT AND PLAN:    1.  Persistent atrial fibrillation: Post cardioversion 01/29/2024.  He is unfortunately back in atrial fibrillation.  He would prefer a rhythm control strategy.  He would prefer to avoid long-term medications.  Due to that, we Michael Melendez plan for ablation.  Risks and benefits have been discussed.  He understands these risks and is agreed to the procedure.  Risk, benefits, and alternatives to EP study and radiofrequency/pulse field ablation for afib were also discussed in detail today. These risks include but are not limited to stroke, bleeding, vascular damage, tamponade, perforation, damage to the esophagus, lungs, and other structures, pulmonary vein stenosis, worsening renal function, and death. The patient understands these risk and wishes to proceed.  We Michael Melendez therefore proceed with catheter ablation at the next available time.  Carto, ICE, anesthesia are requested for the procedure.  Michael Melendez also obtain CT PV protocol prior to the procedure to exclude LAA thrombus and further evaluate atrial anatomy.  2.  Secondary hypercoagulable state: Currently on Eliquis for atrial fibrillation  3.  Hypertension: Mildly elevated today.  Usually well-controlled.  Plan per primary physician.  Follow  up with Dr. Elberta Melendez as usual post procedure  Signed, Michael Melendez Jorja Loa, MD

## 2024-02-07 LAB — CBC
Hematocrit: 49.4 % (ref 37.5–51.0)
Hemoglobin: 16.4 g/dL (ref 13.0–17.7)
MCH: 31.3 pg (ref 26.6–33.0)
MCHC: 33.2 g/dL (ref 31.5–35.7)
MCV: 94 fL (ref 79–97)
Platelets: 227 10*3/uL (ref 150–450)
RBC: 5.24 x10E6/uL (ref 4.14–5.80)
RDW: 12.8 % (ref 11.6–15.4)
WBC: 6.7 10*3/uL (ref 3.4–10.8)

## 2024-02-10 ENCOUNTER — Telehealth: Payer: Self-pay | Admitting: Cardiology

## 2024-02-10 DIAGNOSIS — I4891 Unspecified atrial fibrillation: Secondary | ICD-10-CM | POA: Diagnosis not present

## 2024-02-10 DIAGNOSIS — G4719 Other hypersomnia: Secondary | ICD-10-CM | POA: Diagnosis not present

## 2024-02-10 DIAGNOSIS — I10 Essential (primary) hypertension: Secondary | ICD-10-CM | POA: Diagnosis not present

## 2024-02-10 DIAGNOSIS — G4700 Insomnia, unspecified: Secondary | ICD-10-CM | POA: Diagnosis not present

## 2024-02-10 NOTE — Telephone Encounter (Signed)
 Left detailed message informing pt NPO after MN and no medications in the morning.  6:03 pm -- followed back up and confirmed w/ pt he received my message from earlier.

## 2024-02-10 NOTE — Telephone Encounter (Signed)
 Pt would like a c/b regarding what medications he is able to take in the morning before Ablation. Please advise

## 2024-02-10 NOTE — Pre-Procedure Instructions (Signed)
 Instructed patient on the following items: Arrival time 0800 Nothing to eat or drink after midnight No meds AM of procedure Responsible person to drive you home and stay with you for 24 hrs  Have you missed any doses of anti-coagulant Elqiuis- takes twice a day, hasn't missed any doses.  Don't take dose in the morning.

## 2024-02-11 ENCOUNTER — Encounter (HOSPITAL_COMMUNITY): Payer: Self-pay | Admitting: Cardiology

## 2024-02-11 ENCOUNTER — Ambulatory Visit (HOSPITAL_COMMUNITY)
Admission: RE | Admit: 2024-02-11 | Discharge: 2024-02-11 | Disposition: A | Discharge status: Home / Self Care | Payer: Medicare Other | Attending: Cardiology | Admitting: Cardiology

## 2024-02-11 ENCOUNTER — Other Ambulatory Visit: Payer: Self-pay

## 2024-02-11 ENCOUNTER — Ambulatory Visit (HOSPITAL_COMMUNITY): Payer: Self-pay | Admitting: Anesthesiology

## 2024-02-11 ENCOUNTER — Ambulatory Visit (HOSPITAL_BASED_OUTPATIENT_CLINIC_OR_DEPARTMENT_OTHER): Payer: Self-pay | Admitting: Anesthesiology

## 2024-02-11 ENCOUNTER — Ambulatory Visit (HOSPITAL_COMMUNITY): Payer: Medicare Other

## 2024-02-11 ENCOUNTER — Encounter (HOSPITAL_COMMUNITY)
Admission: RE | Disposition: A | Discharge status: Home / Self Care | Payer: Self-pay | Source: Home / Self Care | Attending: Cardiology

## 2024-02-11 DIAGNOSIS — Z7901 Long term (current) use of anticoagulants: Secondary | ICD-10-CM | POA: Insufficient documentation

## 2024-02-11 DIAGNOSIS — I4819 Other persistent atrial fibrillation: Secondary | ICD-10-CM | POA: Insufficient documentation

## 2024-02-11 DIAGNOSIS — I10 Essential (primary) hypertension: Secondary | ICD-10-CM | POA: Insufficient documentation

## 2024-02-11 DIAGNOSIS — I4891 Unspecified atrial fibrillation: Secondary | ICD-10-CM | POA: Diagnosis not present

## 2024-02-11 DIAGNOSIS — D6869 Other thrombophilia: Secondary | ICD-10-CM | POA: Insufficient documentation

## 2024-02-11 HISTORY — PX: ATRIAL FIBRILLATION ABLATION: EP1191

## 2024-02-11 HISTORY — PX: TRANSESOPHAGEAL ECHOCARDIOGRAM (CATH LAB): EP1270

## 2024-02-11 SURGERY — ATRIAL FIBRILLATION ABLATION
Anesthesia: General

## 2024-02-11 MED ORDER — PHENYLEPHRINE 80 MCG/ML (10ML) SYRINGE FOR IV PUSH (FOR BLOOD PRESSURE SUPPORT)
PREFILLED_SYRINGE | INTRAVENOUS | Status: DC | PRN
  Administered 2024-02-11 (×3): 160 ug via INTRAVENOUS

## 2024-02-11 MED ORDER — ATROPINE SULFATE 1 MG/ML IV SOLN
INTRAVENOUS | Status: DC | PRN
  Administered 2024-02-11: 1 mg via INTRAVENOUS

## 2024-02-11 MED ORDER — DEXAMETHASONE SODIUM PHOSPHATE 10 MG/ML IJ SOLN
INTRAMUSCULAR | Status: DC | PRN
  Administered 2024-02-11: 10 mg via INTRAVENOUS

## 2024-02-11 MED ORDER — HEPARIN SODIUM (PORCINE) 1000 UNIT/ML IJ SOLN
INTRAMUSCULAR | Status: DC | PRN
  Administered 2024-02-11: 6000 [IU] via INTRAVENOUS
  Administered 2024-02-11: 14000 [IU] via INTRAVENOUS

## 2024-02-11 MED ORDER — ACETAMINOPHEN 325 MG PO TABS: 650.0000 mg | ORAL_TABLET | ORAL | Status: DC | PRN

## 2024-02-11 MED ORDER — PHENYLEPHRINE HCL-NACL 20-0.9 MG/250ML-% IV SOLN
INTRAVENOUS | Status: DC | PRN
  Administered 2024-02-11: 40 ug/min via INTRAVENOUS

## 2024-02-11 MED ORDER — SODIUM CHLORIDE 0.9% FLUSH: 3.0000 mL | INTRAVENOUS | Status: DC | PRN

## 2024-02-11 MED ORDER — FENTANYL CITRATE (PF) 100 MCG/2ML IJ SOLN
INTRAMUSCULAR | Status: AC
  Filled 2024-02-11: qty 2

## 2024-02-11 MED ORDER — SODIUM CHLORIDE 0.9 % IV SOLN: INTRAVENOUS | Status: DC

## 2024-02-11 MED ORDER — SODIUM CHLORIDE 0.9 % IV SOLN: 250.0000 mL | INTRAVENOUS | Status: DC | PRN

## 2024-02-11 MED ORDER — HEPARIN (PORCINE) IN NACL 1000-0.9 UT/500ML-% IV SOLN
INTRAVENOUS | Status: DC | PRN
  Administered 2024-02-11 (×3): 500 mL

## 2024-02-11 MED ORDER — SUGAMMADEX SODIUM 200 MG/2ML IV SOLN
INTRAVENOUS | Status: DC | PRN
Start: 2024-02-11 — End: 2024-02-11
  Administered 2024-02-11: 50 mg via INTRAVENOUS
  Administered 2024-02-11: 150 mg via INTRAVENOUS

## 2024-02-11 MED ORDER — HEPARIN SODIUM (PORCINE) 1000 UNIT/ML IJ SOLN
INTRAMUSCULAR | Status: AC
  Filled 2024-02-11: qty 20

## 2024-02-11 MED ORDER — ROCURONIUM BROMIDE 10 MG/ML (PF) SYRINGE
PREFILLED_SYRINGE | INTRAVENOUS | Status: DC | PRN
  Administered 2024-02-11: 10 mg via INTRAVENOUS
  Administered 2024-02-11: 60 mg via INTRAVENOUS
  Administered 2024-02-11: 10 mg via INTRAVENOUS

## 2024-02-11 MED ORDER — LIDOCAINE 2% (20 MG/ML) 5 ML SYRINGE
INTRAMUSCULAR | Status: DC | PRN
  Administered 2024-02-11: 100 mg via INTRAVENOUS

## 2024-02-11 MED ORDER — ATROPINE SULFATE 1 MG/10ML IJ SOSY
PREFILLED_SYRINGE | INTRAMUSCULAR | Status: AC
  Filled 2024-02-11: qty 10

## 2024-02-11 MED ORDER — PHENOL 1.4 % MT LIQD
1.0000 | OROMUCOSAL | Status: DC | PRN
  Administered 2024-02-11: 1 via OROMUCOSAL
  Filled 2024-02-11: qty 177

## 2024-02-11 MED ORDER — ONDANSETRON HCL 4 MG/2ML IJ SOLN: 4.0000 mg | Freq: Four times a day (QID) | INTRAMUSCULAR | Status: DC | PRN

## 2024-02-11 MED ORDER — ONDANSETRON HCL 4 MG/2ML IJ SOLN
INTRAMUSCULAR | Status: DC | PRN
  Administered 2024-02-11: 4 mg via INTRAVENOUS

## 2024-02-11 MED ORDER — PROPOFOL 10 MG/ML IV BOLUS
INTRAVENOUS | Status: DC | PRN
  Administered 2024-02-11: 150 mg via INTRAVENOUS

## 2024-02-11 MED ORDER — FENTANYL CITRATE (PF) 250 MCG/5ML IJ SOLN
INTRAMUSCULAR | Status: DC | PRN
  Administered 2024-02-11 (×2): 50 ug via INTRAVENOUS

## 2024-02-11 MED ORDER — PROTAMINE SULFATE 10 MG/ML IV SOLN
INTRAVENOUS | Status: DC | PRN
  Administered 2024-02-11: 10 mg via INTRAVENOUS
  Administered 2024-02-11: 30 mg via INTRAVENOUS

## 2024-02-11 MED ORDER — ALPRAZOLAM 0.25 MG PO TABS
0.5000 mg | ORAL_TABLET | Freq: Once | ORAL | Status: AC | PRN
  Administered 2024-02-11: 0.5 mg via ORAL
  Filled 2024-02-11: qty 2

## 2024-02-11 SURGICAL SUPPLY — 21 items
BAG SNAP BAND KOVER 36X36 (MISCELLANEOUS) IMPLANT
BLANKET WARM UNDERBOD FULL ACC (MISCELLANEOUS) ×2 IMPLANT
CABLE PFA RX CATH CONN (CABLE) IMPLANT
CATH FARAWAVE ABLATION 31 (CATHETERS) IMPLANT
CATH OCTARAY 2.0 F 3-3-3-3-3 (CATHETERS) IMPLANT
CATH SOUNDSTAR ECO 8FR (CATHETERS) IMPLANT
CATH WEB BI DIR CSDF CRV REPRO (CATHETERS) IMPLANT
CLOSURE MYNX CONTROL 6F/7F (Vascular Products) IMPLANT
CLOSURE PERCLOSE PROSTYLE (VASCULAR PRODUCTS) IMPLANT
COVER SWIFTLINK CONNECTOR (BAG) ×2 IMPLANT
DILATOR VESSEL 38 20CM 16FR (INTRODUCER) IMPLANT
GUIDEWIRE INQWIRE 1.5J.035X260 (WIRE) IMPLANT
INQWIRE 1.5J .035X260CM (WIRE) ×1 IMPLANT
KIT VERSACROSS CNCT FARADRIVE (KITS) IMPLANT
PACK EP LF (CUSTOM PROCEDURE TRAY) ×2 IMPLANT
PAD DEFIB RADIO PHYSIO CONN (PAD) ×2 IMPLANT
PATCH CARTO3 (PAD) IMPLANT
SHEATH FARADRIVE STEERABLE (SHEATH) IMPLANT
SHEATH PINNACLE 8F 10CM (SHEATH) IMPLANT
SHEATH PINNACLE 9F 10CM (SHEATH) IMPLANT
SHEATH PROBE COVER 6X72 (BAG) IMPLANT

## 2024-02-11 NOTE — Interval H&P Note (Signed)
 History and Physical Interval Note:  02/11/2024 9:54 AM  Michael Melendez  has presented today for surgery, with the diagnosis of afib.  The various methods of treatment have been discussed with the patient and family. After consideration of risks, benefits and other options for treatment, the patient has consented to  Procedure(s): ATRIAL FIBRILLATION ABLATION (N/A) TRANSESOPHAGEAL ECHOCARDIOGRAM (N/A) as a surgical intervention.  The patient's history has been reviewed, patient examined, no change in status, stable for surgery.  I have reviewed the patient's chart and labs.  Questions were answered to the patient's satisfaction.     Michael Melendez [employer]

## 2024-02-11 NOTE — Transfer of Care (Signed)
 Immediate Anesthesia Transfer of Care Note  Patient: Michael Melendez  Procedure(s) Performed: ATRIAL FIBRILLATION ABLATION TRANSESOPHAGEAL ECHOCARDIOGRAM  Patient Location: PACU  Anesthesia Type:General  Level of Consciousness: awake, alert , and oriented  Airway & Oxygen Therapy: Patient Spontanous Breathing  Post-op Assessment: Report given to RN and Post -op Vital signs reviewed and stable  Post vital signs: Reviewed and stable  Last Vitals:  Vitals Value Taken Time  BP 109/74 02/11/24 1122  Temp 36.7 C 02/11/24 1123  Pulse 79 02/11/24 1124  Resp 15 02/11/24 1124  SpO2 97 % 02/11/24 1124  Vitals shown include unfiled device data.  Last Pain:  Vitals:   02/11/24 1123  TempSrc: Axillary  PainSc: 0-No pain         Complications: There were no known notable events for this encounter.

## 2024-02-11 NOTE — Anesthesia Postprocedure Evaluation (Signed)
 Anesthesia Post Note  Patient: Michael Melendez  Procedure(s) Performed: ATRIAL FIBRILLATION ABLATION TRANSESOPHAGEAL ECHOCARDIOGRAM     Patient location during evaluation: PACU Anesthesia Type: General Level of consciousness: awake and alert and oriented Pain management: pain level controlled Vital Signs Assessment: post-procedure vital signs reviewed and stable Respiratory status: spontaneous breathing, nonlabored ventilation and respiratory function stable Cardiovascular status: blood pressure returned to baseline and stable Postop Assessment: no apparent nausea or vomiting Anesthetic complications: no   There were no known notable events for this encounter.  Last Vitals:  Vitals:   02/11/24 1230 02/11/24 1245  BP: 109/81 125/81  Pulse: 74 76  Resp: 18 (!) 23  Temp:    SpO2: 100% 98%    Last Pain:  Vitals:   02/11/24 1150  TempSrc: Oral  PainSc:    Pain Goal:                   Danaye Sobh A.

## 2024-02-11 NOTE — Progress Notes (Signed)
 Patient and husband was given discharge instructions. Both verbalized understanding.

## 2024-02-11 NOTE — Anesthesia Preprocedure Evaluation (Signed)
 Anesthesia Evaluation  Patient identified by MRN, date of birth, ID band Patient awake    Reviewed: Allergy & Precautions, H&P , NPO status , Patient's Chart, lab work & pertinent test results  Airway Mallampati: II  TM Distance: >3 FB Neck ROM: Full    Dental no notable dental hx.    Pulmonary neg pulmonary ROS   Pulmonary exam normal breath sounds clear to auscultation       Cardiovascular hypertension, Normal cardiovascular exam+ dysrhythmias Atrial Fibrillation  Rhythm:Regular Rate:Normal     Neuro/Psych  PSYCHIATRIC DISORDERS Anxiety Depression    negative neurological ROS     GI/Hepatic negative GI ROS, Neg liver ROS,,,  Endo/Other  negative endocrine ROS    Renal/GU negative Renal ROS  negative genitourinary   Musculoskeletal negative musculoskeletal ROS (+)    Abdominal   Peds negative pediatric ROS (+)  Hematology negative hematology ROS (+)   Anesthesia Other Findings   Reproductive/Obstetrics negative OB ROS                              Anesthesia Physical Anesthesia Plan  ASA: 2  Anesthesia Plan: General   Post-op Pain Management:    Induction: Intravenous  PONV Risk Score and Plan: 1 and Treatment may vary due to age or medical condition  Airway Management Planned: Nasal Cannula and Natural Airway  Additional Equipment:   Intra-op Plan:   Post-operative Plan: Extubation in OR  Informed Consent: I have reviewed the patients History and Physical, chart, labs and discussed the procedure including the risks, benefits and alternatives for the proposed anesthesia with the patient or authorized representative who has indicated his/her understanding and acceptance.     Dental advisory given  Plan Discussed with: CRNA  Anesthesia Plan Comments:          Anesthesia Quick Evaluation

## 2024-02-11 NOTE — CV Procedure (Signed)
 Procedure: TEE  Indication: Pre-atrial fibrillation ablation.   Sedation: Per anesthesiology  Findings: Please see echo section for full report.  Normal LV size with mild LV hypertrophy.  EF 55%, no wall motion abnormalities.  Normal RV size and systolic fucntion.  Mild left and right atrial enlargement.  No LA appendage thrombus.  Trivial TR.  No PFO or ASD by color doppler.  Trileaflet aortic valve with no stenosis or regurgitation.  Mild mitral regurgitation.  Normal caliber thoracic aorta with minimal plaque.   Michael Melendez 02/11/2024 10:10 AM

## 2024-02-11 NOTE — Interval H&P Note (Signed)
 History and Physical Interval Note:  02/11/2024 8:44 AM  Michael Melendez  has presented today for surgery, with the diagnosis of afib.  The various methods of treatment have been discussed with the patient and family. After consideration of risks, benefits and other options for treatment, the patient has consented to  Procedure(s): ATRIAL FIBRILLATION ABLATION (N/A) TRANSESOPHAGEAL ECHOCARDIOGRAM (N/A) as a surgical intervention.  The patient's history has been reviewed, patient examined, no change in status, stable for surgery.  I have reviewed the patient's chart and labs.  Questions were answered to the patient's satisfaction.     Raeghan Demeter [employer]

## 2024-02-11 NOTE — Discharge Instructions (Signed)

## 2024-02-11 NOTE — Anesthesia Procedure Notes (Signed)
 Procedure Name: Intubation Date/Time: 02/11/2024 9:35 AM  Performed by: Randon Goldsmith, CRNAPre-anesthesia Checklist: Patient identified, Emergency Drugs available, Suction available and Patient being monitored Patient Re-evaluated:Patient Re-evaluated prior to induction Oxygen Delivery Method: Circle system utilized Preoxygenation: Pre-oxygenation with 100% oxygen Induction Type: IV induction Ventilation: Mask ventilation without difficulty Laryngoscope Size: Mac and 4 Grade View: Grade II Tube type: Oral Tube size: 7.5 mm Number of attempts: 1 Airway Equipment and Method: Stylet and Oral airway Placement Confirmation: ETT inserted through vocal cords under direct vision, positive ETCO2 and breath sounds checked- equal and bilateral Secured at: 23 cm Tube secured with: Tape Dental Injury: Teeth and Oropharynx as per pre-operative assessment

## 2024-02-12 ENCOUNTER — Ambulatory Visit (HOSPITAL_COMMUNITY): Payer: Medicare Other | Admitting: Internal Medicine

## 2024-02-12 ENCOUNTER — Telehealth (HOSPITAL_COMMUNITY): Payer: Self-pay

## 2024-02-12 LAB — ECHO TEE: Est EF: 55

## 2024-02-12 LAB — POCT ACTIVATED CLOTTING TIME: Activated Clotting Time: 273 s

## 2024-02-12 NOTE — Telephone Encounter (Signed)
 Spoke with patient to complete post procedure follow up call.  Patient reports no complications with groin sites.   Instructions reviewed with patient:  Remove large bandage at puncture site after 24 hours. It is normal to have bruising, tenderness and a pea or marble sized lump/knot at the groin site which can take up to three months to resolve.  Get help right away if you notice sudden swelling at the puncture site.  Check your puncture site every day for signs of infection: fever, redness, swelling, pus drainage, warmth, foul odor or excessive pain. If this occurs, please call the office at [phone], to speak with the nurse. Get help right away if your puncture site is bleeding and the bleeding does not stop after applying firm pressure to the area.  You may continue to have skipped beats/ atrial fibrillation during the first several months after your procedure.  It is very important not to miss any doses of your blood thinner Eliquis. Patient restarted taking this medication on yesterday, 02/11/24.  You will follow up with the Afib clinic on 03/11/24 and follow up with the APP on 05/12/24.   Patient verbalized understanding to all instructions provided.

## 2024-02-14 MED FILL — Fentanyl Citrate Preservative Free (PF) Inj 100 MCG/2ML: INTRAMUSCULAR | Qty: 2 | Status: AC

## 2024-02-24 ENCOUNTER — Ambulatory Visit: Payer: Medicare Other | Admitting: Internal Medicine

## 2024-02-24 DIAGNOSIS — E349 Endocrine disorder, unspecified: Secondary | ICD-10-CM | POA: Diagnosis not present

## 2024-02-24 DIAGNOSIS — I10 Essential (primary) hypertension: Secondary | ICD-10-CM | POA: Diagnosis not present

## 2024-02-24 DIAGNOSIS — I4891 Unspecified atrial fibrillation: Secondary | ICD-10-CM | POA: Diagnosis not present

## 2024-02-24 DIAGNOSIS — E7800 Pure hypercholesterolemia, unspecified: Secondary | ICD-10-CM | POA: Diagnosis not present

## 2024-02-25 ENCOUNTER — Other Ambulatory Visit (HOSPITAL_COMMUNITY): Payer: Self-pay | Admitting: *Deleted

## 2024-02-25 MED ORDER — APIXABAN 5 MG PO TABS: 5.0000 mg | ORAL_TABLET | Freq: Two times a day (BID) | ORAL | 1 refills | Status: DC

## 2024-02-28 DIAGNOSIS — M1712 Unilateral primary osteoarthritis, left knee: Secondary | ICD-10-CM | POA: Diagnosis not present

## 2024-03-04 DIAGNOSIS — E66811 Obesity, class 1: Secondary | ICD-10-CM | POA: Diagnosis not present

## 2024-03-04 DIAGNOSIS — I10 Essential (primary) hypertension: Secondary | ICD-10-CM | POA: Diagnosis not present

## 2024-03-04 DIAGNOSIS — E8889 Other specified metabolic disorders: Secondary | ICD-10-CM | POA: Diagnosis not present

## 2024-03-04 DIAGNOSIS — E039 Hypothyroidism, unspecified: Secondary | ICD-10-CM | POA: Diagnosis not present

## 2024-03-04 DIAGNOSIS — F418 Other specified anxiety disorders: Secondary | ICD-10-CM | POA: Diagnosis not present

## 2024-03-04 DIAGNOSIS — Z1331 Encounter for screening for depression: Secondary | ICD-10-CM | POA: Diagnosis not present

## 2024-03-04 DIAGNOSIS — Z131 Encounter for screening for diabetes mellitus: Secondary | ICD-10-CM | POA: Diagnosis not present

## 2024-03-04 DIAGNOSIS — E7800 Pure hypercholesterolemia, unspecified: Secondary | ICD-10-CM | POA: Diagnosis not present

## 2024-03-06 ENCOUNTER — Telehealth: Payer: Self-pay

## 2024-03-06 NOTE — Telephone Encounter (Signed)
**Note De-Identified Juventino Pavone Obfuscation** Per the [facility] Provider Portal: The following solutions for the service date entered do not require Pre-Authorization by [employer]: CPT Code: 62130 (CPAP Titration).  I have transferred the CPAP Titration order to the sleep lab so they can contact the pt and schedule test.

## 2024-03-06 NOTE — Addendum Note (Signed)
**Note De-Identified Kairyn Olmeda Obfuscation** Encounter addended by: Demetrios Loll, LPN on: 1/61/0960 3:59 PM  Actions taken: Flowsheet accepted

## 2024-03-10 ENCOUNTER — Ambulatory Visit (HOSPITAL_COMMUNITY): Payer: Medicare Other | Admitting: Internal Medicine

## 2024-03-11 ENCOUNTER — Telehealth (HOSPITAL_COMMUNITY): Payer: Self-pay | Admitting: *Deleted

## 2024-03-11 ENCOUNTER — Ambulatory Visit (HOSPITAL_COMMUNITY)
Admission: RE | Admit: 2024-03-11 | Discharge: 2024-03-11 | Disposition: A | Discharge status: Home / Self Care | Payer: Medicare Other | Source: Ambulatory Visit | Attending: Internal Medicine | Admitting: Internal Medicine

## 2024-03-11 VITALS — BP 136/82 | HR 70 | Ht 68.0 in | Wt 226.2 lb

## 2024-03-11 DIAGNOSIS — M1712 Unilateral primary osteoarthritis, left knee: Secondary | ICD-10-CM | POA: Diagnosis not present

## 2024-03-11 DIAGNOSIS — I4819 Other persistent atrial fibrillation: Secondary | ICD-10-CM | POA: Diagnosis not present

## 2024-03-11 DIAGNOSIS — D6869 Other thrombophilia: Secondary | ICD-10-CM | POA: Diagnosis not present

## 2024-03-11 DIAGNOSIS — Z7901 Long term (current) use of anticoagulants: Secondary | ICD-10-CM | POA: Insufficient documentation

## 2024-03-11 NOTE — Progress Notes (Signed)
 Primary Care Physician: Renford Dills, MD Primary Cardiologist: None Electrophysiologist: Will Jorja Loa, MD     Referring Physician: Dr. Criss Rosales Michael Melendez is a 73 y.o. male with a history of HTN, CAC score 173 (07/16/23), history of kidney stones, gout, HLD, prior tobacco use, and paroxysmal atrial fibrillation who presents for consultation in the [facility]. ED visit on 01/03/24 due to smart watch alerting patient to Afib; no cardiac awareness. He appears to have been in atrial flutter. Patient is on Eliquis 5 mg BID for a CHADS2VASC score of 3.  On evaluation today, he is currently in Afib. He does not have cardiac awareness. He started Eliquis with two full doses Monday after ED visit. He has noted previously to ED some brief intermittent chest pain with and without exertion. History of stress test showing mild ischemia. He wears a watch for monitoring. He notes to have had a sleep study years ago.  On follow up 02/05/24, he is currently in Afib. S/p successful DCCV on 01/29/24. Patient called office on 2/8 noting ERAF. He had wine the night before and watch alerted him the next day. No missed doses of Eliquis.   On follow up 03/11/24, patient is currently in NSR. S/p Afib ablation on 02/11/24 by Dr. Elberta Fortis. No episodes of Afib since ablation. No chest pain or SOB. Leg sites healed without issue. No missed doses of anticoagulant.  Today, he denies symptoms of orthopnea, PND, lower extremity edema, dizziness, presyncope, syncope, snoring, daytime somnolence, bleeding, or neurologic sequela. The patient is tolerating medications without difficulties and is otherwise without complaint today.    Atrial Fibrillation Risk Factors:  he does have symptoms or diagnosis of sleep apnea.   he has a BMI of Body mass index is 34.39 kg/m.Marland Kitchen Filed Weights   03/11/24 0830  Weight: 102.6 kg     Current Outpatient Medications  Medication Sig Dispense Refill    acebutolol (SECTRAL) 200 MG capsule TAKE 1 CAPSULE TWICE DAILY 180 capsule 1   albuterol (VENTOLIN HFA) 108 (90 Base) MCG/ACT inhaler Inhale 2 puffs into the lungs every 6 (six) hours as needed. 18 g 5   allopurinol (ZYLOPRIM) 300 MG tablet Take 300 mg by mouth daily.      apixaban (ELIQUIS) 5 MG TABS tablet Take 1 tablet (5 mg total) by mouth 2 (two) times daily. 180 tablet 1   eszopiclone (LUNESTA) 1 MG TABS tablet Take 1 mg by mouth at bedtime. Sometimes takes 2 tablets at bedtime     levothyroxine (SYNTHROID) 150 MCG tablet Take 150 mcg by mouth daily before breakfast.     magnesium oxide (MAG-OX) 400 (240 Mg) MG tablet Take 800 mg by mouth at bedtime.     Multiple Vitamin (MULTIVITAMIN WITH MINERALS) TABS tablet Take 1 tablet by mouth daily.     omeprazole (PRILOSEC) 40 MG capsule Take 40 mg by mouth 2 (two) times daily before a meal.     testosterone cypionate (DEPOTESTOSTERONE CYPIONATE) 200 MG/ML injection Inject 1.5 mLs into the muscle See admin instructions. Every 3 weeks     atorvastatin (LIPITOR) 10 MG tablet Take 10 mg by mouth See admin instructions. Take 1 tablet by mouth 3 times weekly (Patient not taking: Reported on 03/11/2024)     spironolactone (ALDACTONE) 25 MG tablet Take 1 tablet (25 mg total) by mouth daily. (Patient not taking: Reported on 03/11/2024) 30 tablet 0   No current facility-administered medications for this encounter.  Atrial Fibrillation Management history:  Previous antiarrhythmic drugs: none Previous cardioversions: 01/29/24 Previous ablations: 02/11/24 Anticoagulation history: Eliquis   ROS- All systems are reviewed and negative except as per the HPI above.  Physical Exam: BP 136/82   Pulse 70   Ht 5\' 8"  (1.727 m)   Wt 102.6 kg   BMI 34.39 kg/m   GEN- The patient is well appearing, alert and oriented x 3 today.   Neck - no JVD or carotid bruit noted Lungs- Clear to ausculation bilaterally, normal work of breathing Heart- Regular rate and  rhythm, no murmurs, rubs or gallops, PMI not laterally displaced Extremities- no clubbing, cyanosis, or edema Skin - no rash or ecchymosis noted   EKG today demonstrates  Vent. rate 70 BPM PR interval 234 ms QRS duration 90 ms QT/QTcB 408/440 ms P-R-T axes 51 56 36 Sinus rhythm with 1st degree A-V block with Premature supraventricular complexes Anterior infarct , age undetermined Abnormal ECG When compared with ECG of 11-Feb-2024 11:26, PREVIOUS ECG IS PRESENT  Echo 11/18/2019 demonstrated  Moderate concentric hypertrophy of the left ventricle. Left ventricle  cavity is normal in size. Normal global wall motion. Normal LV systolic  function with EF 55%. Doppler evidence of grade I (impaired) diastolic  dysfunction, normal LAP.  Left atrial cavity is mildly dilated.  Mild (Grade I) mitral regurgitation.  Mild tricuspid regurgitation. Estimated pulmonary artery systolic pressure  is 24 mmHg.   ASSESSMENT & PLAN CHA2DS2-VASc Score = 3  The patient's score is based upon: CHF History: 0 HTN History: 1 Diabetes History: 0 Stroke History: 0 Vascular Disease History: 1 Age Score: 1 Gender Score: 0      ASSESSMENT AND PLAN: Persistent Atrial Fibrillation (ICD10:  I48.19) The patient's CHA2DS2-VASc score is 3, indicating a 3.2% annual risk of stroke.   S/p successful DCCV on 01/29/24. S/p Afib ablation on 02/11/24 by Dr. Elberta Fortis.  He is currently in NSR.  Secondary Hypercoagulable State (ICD10:  D68.69) The patient is at significant risk for stroke/thromboembolism based upon his CHA2DS2-VASc Score of 3.  Continue Apixaban (Eliquis).  Continue without interruption. He asked about coming off Eliquis and I gave patient Watchman procedure pamphlet.     Follow up with EP as scheduled.    [name], PA-C  Afib [facility] [address] [place], [place] [address] [phone]

## 2024-03-11 NOTE — Telephone Encounter (Addendum)
 Pt states he [facility] and spironolactone which are managed by Dr. Jacinto Halim. Will forward to his office for refills.

## 2024-03-18 DIAGNOSIS — G4733 Obstructive sleep apnea (adult) (pediatric): Secondary | ICD-10-CM | POA: Diagnosis not present

## 2024-03-18 DIAGNOSIS — E7800 Pure hypercholesterolemia, unspecified: Secondary | ICD-10-CM | POA: Diagnosis not present

## 2024-03-18 DIAGNOSIS — N401 Enlarged prostate with lower urinary tract symptoms: Secondary | ICD-10-CM | POA: Diagnosis not present

## 2024-03-18 DIAGNOSIS — E65 Localized adiposity: Secondary | ICD-10-CM | POA: Diagnosis not present

## 2024-03-18 DIAGNOSIS — I10 Essential (primary) hypertension: Secondary | ICD-10-CM | POA: Diagnosis not present

## 2024-03-18 DIAGNOSIS — R972 Elevated prostate specific antigen [PSA]: Secondary | ICD-10-CM | POA: Diagnosis not present

## 2024-03-18 DIAGNOSIS — R361 Hematospermia: Secondary | ICD-10-CM | POA: Diagnosis not present

## 2024-03-18 DIAGNOSIS — E6609 Other obesity due to excess calories: Secondary | ICD-10-CM | POA: Diagnosis not present

## 2024-03-18 DIAGNOSIS — M1712 Unilateral primary osteoarthritis, left knee: Secondary | ICD-10-CM | POA: Diagnosis not present

## 2024-03-18 DIAGNOSIS — R7303 Prediabetes: Secondary | ICD-10-CM | POA: Diagnosis not present

## 2024-03-18 DIAGNOSIS — E559 Vitamin D deficiency, unspecified: Secondary | ICD-10-CM | POA: Diagnosis not present

## 2024-03-18 DIAGNOSIS — R3912 Poor urinary stream: Secondary | ICD-10-CM | POA: Diagnosis not present

## 2024-03-23 DIAGNOSIS — I10 Essential (primary) hypertension: Secondary | ICD-10-CM | POA: Diagnosis not present

## 2024-03-23 DIAGNOSIS — G4731 Primary central sleep apnea: Secondary | ICD-10-CM | POA: Diagnosis not present

## 2024-03-23 DIAGNOSIS — G4700 Insomnia, unspecified: Secondary | ICD-10-CM | POA: Diagnosis not present

## 2024-03-23 DIAGNOSIS — E039 Hypothyroidism, unspecified: Secondary | ICD-10-CM | POA: Diagnosis not present

## 2024-03-25 DIAGNOSIS — M1712 Unilateral primary osteoarthritis, left knee: Secondary | ICD-10-CM | POA: Diagnosis not present

## 2024-03-26 DIAGNOSIS — R972 Elevated prostate specific antigen [PSA]: Secondary | ICD-10-CM | POA: Diagnosis not present

## 2024-04-01 DIAGNOSIS — E7800 Pure hypercholesterolemia, unspecified: Secondary | ICD-10-CM | POA: Diagnosis not present

## 2024-04-01 DIAGNOSIS — Z6834 Body mass index (BMI) 34.0-34.9, adult: Secondary | ICD-10-CM | POA: Diagnosis not present

## 2024-04-01 DIAGNOSIS — E66811 Obesity, class 1: Secondary | ICD-10-CM | POA: Diagnosis not present

## 2024-04-01 DIAGNOSIS — R7303 Prediabetes: Secondary | ICD-10-CM | POA: Diagnosis not present

## 2024-04-01 DIAGNOSIS — I10 Essential (primary) hypertension: Secondary | ICD-10-CM | POA: Diagnosis not present

## 2024-04-02 ENCOUNTER — Ambulatory Visit: Admitting: Internal Medicine

## 2024-04-02 ENCOUNTER — Encounter: Payer: Self-pay | Admitting: Internal Medicine

## 2024-04-02 VITALS — BP 126/74 | HR 68 | Ht 68.0 in | Wt 225.4 lb

## 2024-04-02 DIAGNOSIS — Z9109 Other allergy status, other than to drugs and biological substances: Secondary | ICD-10-CM | POA: Diagnosis not present

## 2024-04-02 DIAGNOSIS — K219 Gastro-esophageal reflux disease without esophagitis: Secondary | ICD-10-CM | POA: Diagnosis not present

## 2024-04-02 DIAGNOSIS — J4520 Mild intermittent asthma, uncomplicated: Secondary | ICD-10-CM | POA: Diagnosis not present

## 2024-04-02 NOTE — Patient Instructions (Addendum)
 It was a pleasure to see you today!  Please schedule follow up with me as needed. Please call [phone] if issues or concerns arise. You can also send [place] a message through MyChart, but but aware that this is not to be used for urgent issues and it may take up to 5-7 days to receive a reply. Please be aware that you will likely be able to view your results before I have a chance to respond to them. Please give [place] 5 business days to respond to any non-urgent results.    Glad your breathing is doing better.  If you get short of breath around the time of an upper respiratory infection you can try the albuterol inhaler as needed.  Most of your symptoms about throat clearing are related to the reflux.  I am glad it is doing better.  Ready for cutting out alcohol and modifying you diet.  Treating your sleep apnea, weight loss, and improving mobility are all good steps in the right direction. I am okay with you tapering down your omeprazole from twice daily down to once daily.  You can also go to every other day and taper yourself off and monitor your symptoms  All of your conditions: The prediabetes, the sleep apnea, the reflux are potentially curable.  Weight loss will go a long way.  You can look into the Koufman reflux diet. [email or web address]

## 2024-04-02 NOTE — Progress Notes (Signed)
 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   Interval Updates: Here for follow up after trial of albuterol inhaler. Has not needed to use it at all.   Has had a fib ablation.  Cut out alcohol and spicy foods.   Has lost some weight.   Diagnosed with sleep apnea and is starting cpap.   Starting injectable medication for weight loss.   Had knee injection and is able to walk more now.   I have reviewed the patient's family social and past medical history and updated as appropriate.   Past Medical History:  Diagnosis Date   Carpal tunnel syndrome    on right pt says surgery 12-24-11 never occurred   Depression    Diverticulitis    Gout    History of kidney stones    none in years   Hyperlipidemia    Hypertension     Past Surgical History:  Procedure Laterality Date   ANKLE RECONSTRUCTION Bilateral 1996   ATRIAL FIBRILLATION ABLATION N/A 02/11/2024   Procedure: ATRIAL FIBRILLATION ABLATION;  Surgeon: Regan Lemming, MD;  Location: [facility] INVASIVE CV LAB;  Service: Cardiovascular;  Laterality: N/A;   CARDIOVERSION N/A 01/29/2024   Procedure: CARDIOVERSION;  Surgeon: Quintella Reichert, MD;  Location: [facility] INVASIVE CV LAB;  Service: Cardiovascular;  Laterality: N/A;   COLONOSCOPY WITH PROPOFOL N/A 07/27/2014   Procedure: COLONOSCOPY WITH PROPOFOL;  Surgeon: Charolett Bumpers, MD;  Location: [facility] ENDOSCOPY;  Service: Endoscopy;  Laterality: N/A;   ELBOW BURSA SURGERY Right    SHOULDER ARTHROSCOPY Bilateral 1980   rotator cuff repair   TRANSESOPHAGEAL ECHOCARDIOGRAM (CATH LAB) N/A 02/11/2024   Procedure: TRANSESOPHAGEAL ECHOCARDIOGRAM;  Surgeon: Regan Lemming, MD;  Location: [facility] INVASIVE CV LAB;  Service:  Cardiovascular;  Laterality: N/A;    Family History  Problem Relation Age of Onset   Dementia Mother    AAA (abdominal aortic aneurysm) Father    Dementia Father    AAA (abdominal aortic aneurysm) Sister    Atrial fibrillation Sister    Cervical cancer Sister    Anesthesia problems Neg Hx    Lung disease Neg Hx     Social History   Occupational History   Not on file  Tobacco Use   Smoking status: Never    Passive exposure: Never   Smokeless tobacco: Never   Tobacco comments:    Never smoked 01/20/24  Vaping Use   Vaping status: Never Used  Substance and Sexual Activity   Alcohol use: Yes    Alcohol/week: 7.0 standard drinks of alcohol    Types: 7 Glasses of wine per week    Comment: 1 bootle of wine per week 01/20/24   Drug use: No   Sexual activity: Not on file     Physical Exam: Blood pressure 126/74, pulse 68, height 5\' 8"  (1.727 m), weight 225 lb 6.4 oz (102.2 kg), SpO2 95%.  Gen:      No acute distress, protuberant abdomen ENT:  no nasal polyps, mucus membranes moist Lungs:    No increased respiratory effort, symmetric chest wall excursion, clear to auscultation bilaterally, no wheezes or crackles CV:  Regular rate and rhythm; no murmurs, rubs, or gallops.  No pedal edema   Data Reviewed: Imaging: I have personally reviewed the CT coronary calcium score July 2024 - small LLL granuloma.  PFTs:   Labs: Region 2 allergy panel - elevated to dustmites Eosinophil count 400  Immunization status: Immunization History  Administered Date(s) Administered   PFIZER(Purple Top)SARS-COV-2 Vaccination 02/01/2020, 02/26/2020   PNEUMOCOCCAL CONJUGATE-20 09/18/2023    External Records Personally Reviewed: cardiology  Assessment:  LPRD Possible Asthma Dustmite sensitivity  Plan/Recommendations:   Glad your breathing is doing better.  If you get short of breath around the time of an upper respiratory infection you can try the albuterol inhaler as  needed.  Most of your symptoms about throat clearing are related to the reflux.  I am glad it is doing better.  Ready for cutting out alcohol and modifying you diet.  Treating your sleep apnea, weight loss, and improving mobility are all good steps in the right direction. I am okay with you tapering down your omeprazole from twice daily down to once daily.  You can also go to every other day and taper yourself off and monitor your symptoms  All of your conditions: The prediabetes, the sleep apnea, the reflux are potentially curable.  Weight loss will go a long way.  You can look into the Koufman reflux diet. [email or web address]  Dustmite precautions discussed.    Return to Care: Return if symptoms worsen or fail to improve.   Durel Salts, MD Pulmonary and Critical Care Medicine [facility] [phone]

## 2024-04-13 DIAGNOSIS — K080 Exfoliation of teeth due to systemic causes: Secondary | ICD-10-CM | POA: Diagnosis not present

## 2024-04-15 DIAGNOSIS — I10 Essential (primary) hypertension: Secondary | ICD-10-CM | POA: Diagnosis not present

## 2024-04-15 DIAGNOSIS — G4731 Primary central sleep apnea: Secondary | ICD-10-CM | POA: Diagnosis not present

## 2024-04-15 DIAGNOSIS — E65 Localized adiposity: Secondary | ICD-10-CM | POA: Diagnosis not present

## 2024-04-15 DIAGNOSIS — Z6833 Body mass index (BMI) 33.0-33.9, adult: Secondary | ICD-10-CM | POA: Diagnosis not present

## 2024-04-15 DIAGNOSIS — E7800 Pure hypercholesterolemia, unspecified: Secondary | ICD-10-CM | POA: Diagnosis not present

## 2024-04-15 DIAGNOSIS — E6609 Other obesity due to excess calories: Secondary | ICD-10-CM | POA: Diagnosis not present

## 2024-04-22 DIAGNOSIS — D485 Neoplasm of uncertain behavior of skin: Secondary | ICD-10-CM | POA: Diagnosis not present

## 2024-04-22 DIAGNOSIS — D045 Carcinoma in situ of skin of trunk: Secondary | ICD-10-CM | POA: Diagnosis not present

## 2024-04-22 DIAGNOSIS — L820 Inflamed seborrheic keratosis: Secondary | ICD-10-CM | POA: Diagnosis not present

## 2024-04-22 DIAGNOSIS — Z789 Other specified health status: Secondary | ICD-10-CM | POA: Diagnosis not present

## 2024-04-22 DIAGNOSIS — R208 Other disturbances of skin sensation: Secondary | ICD-10-CM | POA: Diagnosis not present

## 2024-04-22 DIAGNOSIS — L2989 Other pruritus: Secondary | ICD-10-CM | POA: Diagnosis not present

## 2024-04-29 DIAGNOSIS — I10 Essential (primary) hypertension: Secondary | ICD-10-CM | POA: Diagnosis not present

## 2024-04-29 DIAGNOSIS — E65 Localized adiposity: Secondary | ICD-10-CM | POA: Diagnosis not present

## 2024-04-29 DIAGNOSIS — F418 Other specified anxiety disorders: Secondary | ICD-10-CM | POA: Diagnosis not present

## 2024-04-29 DIAGNOSIS — G4731 Primary central sleep apnea: Secondary | ICD-10-CM | POA: Diagnosis not present

## 2024-05-06 DIAGNOSIS — G4733 Obstructive sleep apnea (adult) (pediatric): Secondary | ICD-10-CM | POA: Diagnosis not present

## 2024-05-11 NOTE — Progress Notes (Deleted)
  Electrophysiology Office Note:   Date:  05/11/2024  ID:  Michael Melendez, DOB 08-31-1951, MRN [id number]  Primary Cardiologist: None Primary Heart Failure: None Electrophysiologist: Will Cortland Ding, MD  {Click to update primary MD,subspecialty MD or APP then REFRESH:1}    History of Present Illness:   Michael Melendez is a 73 y.o. male with h/o AF, HTN, anxiety seen today for routine electrophysiology follow-up s/p Ablation.  Since last being seen in our clinic the patient reports doing ***.  he denies chest pain, palpitations, dyspnea, PND, orthopnea, nausea, vomiting, dizziness, syncope, edema, weight gain, or early satiety.    Review of systems complete and found to be negative unless listed in HPI.   EP Information / Studies Reviewed:    EKG is ordered today. Personal review as below.      Studies:  CT Cardiac Scoring 07/15/23 > CAC score 173 / 47th percentile for matched controls, mildly dilated ascending aorta up to 40 mm  TEE 02/11/24 > LVEF 55%, no RWMA, LA moderately dilated, RA mildly dilated, no PFO/ASD  EPS 02/11/24 > AF upon presentation, successful electrical isolation / anatomical encircling of all 4 PV's with PF ablation, ablation of posterior wall with PF  Arrhythmia / AAD AF  DCCV 01/29/24    Risk Assessment/Calculations:    CHA2DS2-VASc Score = 3  {Confirm score is correct.  If not, click here to update score.  REFRESH note.  :1} This indicates a 3.2% annual risk of stroke. The patient's score is based upon: CHF History: 0 HTN History: 1 Diabetes History: 0 Stroke History: 0 Vascular Disease History: 1 Age Score: 1 Gender Score: 0   {This patient has a significant risk of stroke if diagnosed with atrial fibrillation.  Please consider VKA or DOAC agent for anticoagulation if the bleeding risk is acceptable.   You can also use the SmartPhrase .[email or web address] for documentation.   :[id number]} No BP recorded.  {Refresh Note OR Click here to enter BP  :1}***    STOP-Bang Score:  5  { Consider Dx Sleep Disordered Breathing or Sleep Apnea  ICD G47.33          :1}     Physical Exam:   VS:  There were no vitals taken for this visit.   Wt Readings from Last 3 Encounters:  04/02/24 225 lb 6.4 oz (102.2 kg)  03/11/24 226 lb 3.2 oz (102.6 kg)  02/11/24 220 lb (99.8 kg)     GEN: Well nourished, well developed in no acute distress NECK: No JVD; No carotid bruits CARDIAC: {EPRHYTHM:28826}, no murmurs, rubs, gallops RESPIRATORY:  Clear to auscultation without rales, wheezing or rhonchi  ABDOMEN: Soft, non-tender, non-distended EXTREMITIES:  No edema; No deformity   ASSESSMENT AND PLAN:    Persistent Atrial Fibrillation  CHA2DS2-VASc 3, s/p PVI ablation 02/11/24  -continue OAC for stroke prophylaxis  -EKG with NSR ***  -no symptom burden since ablation  -monitors with ***   Secondary Hypercoagulable State  -continue Eliquis  5mg  BID, dose reviewed and appropriate by age/wt    Follow up with Dr. Lawana Pray {EPFOLLOW NW:29562}  Signed, Creighton Doffing, NP-C, AGACNP-BC [facility] - Electrophysiology  05/11/2024, 1:30 PM

## 2024-05-12 ENCOUNTER — Ambulatory Visit: Payer: Medicare Other | Attending: Pulmonary Disease | Admitting: Pulmonary Disease

## 2024-05-13 ENCOUNTER — Encounter: Payer: Self-pay | Admitting: Pulmonary Disease

## 2024-05-26 DIAGNOSIS — E6609 Other obesity due to excess calories: Secondary | ICD-10-CM | POA: Diagnosis not present

## 2024-05-26 DIAGNOSIS — I10 Essential (primary) hypertension: Secondary | ICD-10-CM | POA: Diagnosis not present

## 2024-05-26 DIAGNOSIS — R7303 Prediabetes: Secondary | ICD-10-CM | POA: Diagnosis not present

## 2024-05-26 DIAGNOSIS — G4731 Primary central sleep apnea: Secondary | ICD-10-CM | POA: Diagnosis not present

## 2024-05-26 DIAGNOSIS — Z6831 Body mass index (BMI) 31.0-31.9, adult: Secondary | ICD-10-CM | POA: Diagnosis not present

## 2024-05-26 DIAGNOSIS — E66811 Obesity, class 1: Secondary | ICD-10-CM | POA: Diagnosis not present

## 2024-05-26 DIAGNOSIS — F418 Other specified anxiety disorders: Secondary | ICD-10-CM | POA: Diagnosis not present

## 2024-05-29 DIAGNOSIS — J320 Chronic maxillary sinusitis: Secondary | ICD-10-CM | POA: Diagnosis not present

## 2024-06-02 ENCOUNTER — Other Ambulatory Visit (HOSPITAL_COMMUNITY): Payer: Self-pay | Admitting: *Deleted

## 2024-06-02 MED ORDER — APIXABAN 5 MG PO TABS: 5.0000 mg | ORAL_TABLET | Freq: Two times a day (BID) | ORAL | Status: DC

## 2024-06-05 ENCOUNTER — Other Ambulatory Visit (HOSPITAL_COMMUNITY): Payer: Self-pay

## 2024-06-05 MED ORDER — APIXABAN 5 MG PO TABS: 5.0000 mg | ORAL_TABLET | Freq: Two times a day (BID) | ORAL | 0 refills | Status: DC

## 2024-06-06 DIAGNOSIS — G4733 Obstructive sleep apnea (adult) (pediatric): Secondary | ICD-10-CM | POA: Diagnosis not present

## 2024-06-12 DIAGNOSIS — I4891 Unspecified atrial fibrillation: Secondary | ICD-10-CM | POA: Diagnosis not present

## 2024-06-12 DIAGNOSIS — I10 Essential (primary) hypertension: Secondary | ICD-10-CM | POA: Diagnosis not present

## 2024-06-17 ENCOUNTER — Other Ambulatory Visit (HOSPITAL_COMMUNITY): Payer: Self-pay | Admitting: *Deleted

## 2024-06-17 DIAGNOSIS — L700 Acne vulgaris: Secondary | ICD-10-CM | POA: Diagnosis not present

## 2024-06-17 DIAGNOSIS — L905 Scar conditions and fibrosis of skin: Secondary | ICD-10-CM | POA: Diagnosis not present

## 2024-06-17 DIAGNOSIS — C44529 Squamous cell carcinoma of skin of other part of trunk: Secondary | ICD-10-CM | POA: Diagnosis not present

## 2024-06-17 MED ORDER — APIXABAN 5 MG PO TABS: 5.0000 mg | ORAL_TABLET | Freq: Two times a day (BID) | ORAL | 1 refills | Status: DC

## 2024-06-22 ENCOUNTER — Other Ambulatory Visit (HOSPITAL_COMMUNITY): Payer: Self-pay | Admitting: *Deleted

## 2024-06-22 DIAGNOSIS — E785 Hyperlipidemia, unspecified: Secondary | ICD-10-CM | POA: Diagnosis not present

## 2024-06-22 DIAGNOSIS — E6609 Other obesity due to excess calories: Secondary | ICD-10-CM | POA: Diagnosis not present

## 2024-06-22 DIAGNOSIS — E7800 Pure hypercholesterolemia, unspecified: Secondary | ICD-10-CM | POA: Diagnosis not present

## 2024-06-22 DIAGNOSIS — F418 Other specified anxiety disorders: Secondary | ICD-10-CM | POA: Diagnosis not present

## 2024-06-22 MED ORDER — APIXABAN 5 MG PO TABS: 5.0000 mg | ORAL_TABLET | Freq: Two times a day (BID) | ORAL | 0 refills | Status: DC

## 2024-06-24 DIAGNOSIS — E6609 Other obesity due to excess calories: Secondary | ICD-10-CM | POA: Diagnosis not present

## 2024-06-24 DIAGNOSIS — Z6831 Body mass index (BMI) 31.0-31.9, adult: Secondary | ICD-10-CM | POA: Diagnosis not present

## 2024-06-24 DIAGNOSIS — I10 Essential (primary) hypertension: Secondary | ICD-10-CM | POA: Diagnosis not present

## 2024-06-24 DIAGNOSIS — E559 Vitamin D deficiency, unspecified: Secondary | ICD-10-CM | POA: Diagnosis not present

## 2024-06-24 DIAGNOSIS — R7303 Prediabetes: Secondary | ICD-10-CM | POA: Diagnosis not present

## 2024-06-24 DIAGNOSIS — G4731 Primary central sleep apnea: Secondary | ICD-10-CM | POA: Diagnosis not present

## 2024-07-06 DIAGNOSIS — G4733 Obstructive sleep apnea (adult) (pediatric): Secondary | ICD-10-CM | POA: Diagnosis not present

## 2024-07-11 DIAGNOSIS — I10 Essential (primary) hypertension: Secondary | ICD-10-CM | POA: Diagnosis not present

## 2024-07-11 DIAGNOSIS — I4891 Unspecified atrial fibrillation: Secondary | ICD-10-CM | POA: Diagnosis not present

## 2024-07-22 DIAGNOSIS — E6609 Other obesity due to excess calories: Secondary | ICD-10-CM | POA: Diagnosis not present

## 2024-07-22 DIAGNOSIS — I10 Essential (primary) hypertension: Secondary | ICD-10-CM | POA: Diagnosis not present

## 2024-07-22 DIAGNOSIS — E559 Vitamin D deficiency, unspecified: Secondary | ICD-10-CM | POA: Diagnosis not present

## 2024-07-22 DIAGNOSIS — R7303 Prediabetes: Secondary | ICD-10-CM | POA: Diagnosis not present

## 2024-07-22 DIAGNOSIS — Z6830 Body mass index (BMI) 30.0-30.9, adult: Secondary | ICD-10-CM | POA: Diagnosis not present

## 2024-07-22 DIAGNOSIS — E65 Localized adiposity: Secondary | ICD-10-CM | POA: Diagnosis not present

## 2024-07-23 DIAGNOSIS — E7800 Pure hypercholesterolemia, unspecified: Secondary | ICD-10-CM | POA: Diagnosis not present

## 2024-07-23 DIAGNOSIS — F418 Other specified anxiety disorders: Secondary | ICD-10-CM | POA: Diagnosis not present

## 2024-07-23 DIAGNOSIS — E785 Hyperlipidemia, unspecified: Secondary | ICD-10-CM | POA: Diagnosis not present

## 2024-07-23 DIAGNOSIS — I10 Essential (primary) hypertension: Secondary | ICD-10-CM | POA: Diagnosis not present

## 2024-07-23 DIAGNOSIS — E6609 Other obesity due to excess calories: Secondary | ICD-10-CM | POA: Diagnosis not present

## 2024-07-23 DIAGNOSIS — I4891 Unspecified atrial fibrillation: Secondary | ICD-10-CM | POA: Diagnosis not present

## 2024-08-23 DIAGNOSIS — I4891 Unspecified atrial fibrillation: Secondary | ICD-10-CM | POA: Diagnosis not present

## 2024-08-23 DIAGNOSIS — I10 Essential (primary) hypertension: Secondary | ICD-10-CM | POA: Diagnosis not present

## 2024-09-04 ENCOUNTER — Other Ambulatory Visit (HOSPITAL_COMMUNITY): Payer: Self-pay | Admitting: *Deleted

## 2024-09-04 MED ORDER — APIXABAN 5 MG PO TABS: 5.0000 mg | ORAL_TABLET | Freq: Two times a day (BID) | ORAL | 3 refills | Status: DC

## 2024-09-05 NOTE — Progress Notes (Signed)
 Electrophysiology Office Note:   Date:  09/07/2024  ID:  Michael Melendez, DOB [birth year], MRN [id number]  Primary Cardiologist: None Primary Heart Failure: None Electrophysiologist: Will Gladis Norton, MD      History of Present Illness:   Michael Melendez is a 73 y.o. male with h/o AF, HTN, CAC score 173, HLD, prior tobacco abuse, gout, kidney stones seen today for routine electrophysiology follow-up s/p Ablation.  Since last being seen in our clinic the patient reports doing well overall. He wears an Scientist, physiological and has not been notified of AF. He feels really well. He has been on Zepbound and lost 35 lbs.  He thinks he doing better in terms of his sleep / less apnea with his weight loss. He walks frequently > did 5 miles yesterday. He states he also drank a bottle of wine in the evening on accident. He got distracted watching TV.  No further symptoms of AF.  He denies chest pain, palpitations, dyspnea, PND, orthopnea, nausea, vomiting, dizziness, syncope, edema, weight gain, or early satiety.    Review of systems complete and found to be negative unless listed in HPI.   EP Information / Studies Reviewed:    EKG is ordered today. Personal review as below.  EKG Interpretation Date/Time:  Monday September 07 2024 08:53:40 EDT Ventricular Rate:  70 PR Interval:  224 QRS Duration:  90 QT Interval:  392 QTC Calculation: 423 R Axis:   56  Text Interpretation: Sinus rhythm with 1st degree A-V block Confirmed by Aniceto Jarvis ([id number]) on 09/07/2024 9:10:44 AM    Arrhythmia / AAD / Pertinent EP Studies AF  DCCV 01/29/24 > ERAF 02/01/24  EPS 02/11/24 > PVI ablation with PF energy, ablation of posterior wall      Risk Assessment/Calculations:    CHA2DS2-VASc Score = 3   This indicates a 3.2% annual risk of stroke. The patient's score is based upon: CHF History: 0 HTN History: 1 Diabetes History: 0 Stroke History: 0 Vascular Disease History: 1 Age Score: 1 Gender Score: 0         STOP-Bang Score:  5       Physical Exam:   VS:  BP 126/64   Pulse 70   Ht 5' 8 (1.727 m)   Wt 197 lb 3.2 oz (89.4 kg)   SpO2 99%   BMI 29.98 kg/m    Wt Readings from Last 3 Encounters:  09/07/24 197 lb 3.2 oz (89.4 kg)  04/02/24 225 lb 6.4 oz (102.2 kg)  03/11/24 226 lb 3.2 oz (102.6 kg)     GEN: pleasant, well nourished, well developed in no acute distress NECK: No JVD; No carotid bruits CARDIAC: Regular rate and rhythm, no murmurs, rubs, gallops RESPIRATORY:  Clear to auscultation without rales, wheezing or rhonchi  ABDOMEN: Soft, non-tender, non-distended EXTREMITIES:  No edema; No deformity   ASSESSMENT AND PLAN:    Persistent Atrial Fibrillation  CHA2DS2-VASc 3, s/p PVI + PW ablation 02/11/24  -OAC for stroke prophylaxis  -no AF symptoms post ablation   -discussed modifiable / non-modifiable risk factors -EKG with NSR, 1st degree AVB  Secondary Hypercoagulable State  -continue Eliquis  5mg  BID, dose reviewed and appropriate by age / wt  -Watchman discussed in AF Clinic, reviewed if he has further interest to let our team know  Hypertension  -elevated in clinic > he ran out of sprionolactone > will fill for 3 months to allow time to get back to Cardiology, then defer further mgmt to Cardiology  -  continue acebutolol  200 mg BID  -prior facial swelling with ACE-I / ARB   Intentional Weight Loss  -down 35 lbs on Zepbound through PCP  Follow up with Dr. Inocencio in 12 months or sooner if new needs arise.   Signed, Daphne Barrack, NP-C, AGACNP-BC [facility] - Electrophysiology  09/07/2024, 9:44 AM

## 2024-09-07 ENCOUNTER — Encounter: Payer: Self-pay | Admitting: Pulmonary Disease

## 2024-09-07 ENCOUNTER — Ambulatory Visit: Attending: Pulmonary Disease | Admitting: Pulmonary Disease

## 2024-09-07 VITALS — BP 126/64 | HR 70 | Ht 68.0 in | Wt 197.2 lb

## 2024-09-07 DIAGNOSIS — I10 Essential (primary) hypertension: Secondary | ICD-10-CM

## 2024-09-07 DIAGNOSIS — D6869 Other thrombophilia: Secondary | ICD-10-CM

## 2024-09-07 DIAGNOSIS — I4819 Other persistent atrial fibrillation: Secondary | ICD-10-CM | POA: Diagnosis not present

## 2024-09-07 MED ORDER — SPIRONOLACTONE 25 MG PO TABS: 25.0000 mg | ORAL_TABLET | Freq: Every day | ORAL | 3 refills | Status: DC

## 2024-09-07 NOTE — Patient Instructions (Signed)
 Medication Instructions:   Your physician recommends that you continue on your current medications as directed. Please refer to the Current Medication list given to you today.  *If you need a refill on your cardiac medications before your next appointment, please call your pharmacy*    Lab Work: NONE ORDERED  TODAY    If you have labs (blood work) drawn today and your tests are completely normal, you will receive your results only by: MyChart Message (if you have MyChart) OR A paper copy in the mail If you have any lab test that is abnormal or we need to change your treatment, we will call you to review the results.    Testing/Procedures: NONE ORDERED  TODAY      Follow-Up: At [facility], you and your health needs are our priority.  As part of our continuing mission to provide you with exceptional heart care, our providers are all part of one team.  This team includes your primary Cardiologist (physician) and Advanced Practice Providers or APPs (Physician Assistants and Nurse Practitioners) who all work together to provide you with the care you need, when you need it.  Your next appointment: DR LADONA  NEXT AVAILABLE   1 year(s)  Provider:  You may see Will Gladis Norton, MD  or Daphne Barrack, NP ( CONTACT  CASSIE HALL/ ANGELINE HAMMER FOR EP SCHEDULING ISSUES )    We recommend signing up for the patient portal called MyChart.  Sign up information is provided on this After Visit Summary.  MyChart is used to connect with patients for Virtual Visits (Telemedicine).  Patients are able to view lab/test results, encounter notes, upcoming appointments, etc.  Non-urgent messages can be sent to your provider as well.   To learn more about what you can do with MyChart, go to [email or web address].   Other Instructions

## 2024-09-09 ENCOUNTER — Other Ambulatory Visit (HOSPITAL_COMMUNITY): Payer: Self-pay | Admitting: *Deleted

## 2024-09-09 DIAGNOSIS — I4891 Unspecified atrial fibrillation: Secondary | ICD-10-CM | POA: Diagnosis not present

## 2024-09-09 DIAGNOSIS — I10 Essential (primary) hypertension: Secondary | ICD-10-CM | POA: Diagnosis not present

## 2024-09-09 MED ORDER — APIXABAN 5 MG PO TABS: 5.0000 mg | ORAL_TABLET | Freq: Two times a day (BID) | ORAL | 0 refills | Status: AC

## 2024-09-10 DIAGNOSIS — D485 Neoplasm of uncertain behavior of skin: Secondary | ICD-10-CM | POA: Diagnosis not present

## 2024-09-10 DIAGNOSIS — L810 Postinflammatory hyperpigmentation: Secondary | ICD-10-CM | POA: Diagnosis not present

## 2024-09-10 DIAGNOSIS — L439 Lichen planus, unspecified: Secondary | ICD-10-CM | POA: Diagnosis not present

## 2024-09-22 DIAGNOSIS — E6609 Other obesity due to excess calories: Secondary | ICD-10-CM | POA: Diagnosis not present

## 2024-09-22 DIAGNOSIS — E785 Hyperlipidemia, unspecified: Secondary | ICD-10-CM | POA: Diagnosis not present

## 2024-09-22 DIAGNOSIS — R972 Elevated prostate specific antigen [PSA]: Secondary | ICD-10-CM | POA: Diagnosis not present

## 2024-09-22 DIAGNOSIS — F418 Other specified anxiety disorders: Secondary | ICD-10-CM | POA: Diagnosis not present

## 2024-09-22 DIAGNOSIS — E7800 Pure hypercholesterolemia, unspecified: Secondary | ICD-10-CM | POA: Diagnosis not present

## 2024-09-24 ENCOUNTER — Encounter: Payer: Self-pay | Admitting: Adult Health

## 2024-09-24 ENCOUNTER — Other Ambulatory Visit: Payer: Self-pay | Admitting: Adult Health

## 2024-09-24 DIAGNOSIS — R972 Elevated prostate specific antigen [PSA]: Secondary | ICD-10-CM

## 2024-09-28 DIAGNOSIS — E039 Hypothyroidism, unspecified: Secondary | ICD-10-CM | POA: Diagnosis not present

## 2024-09-28 DIAGNOSIS — I10 Essential (primary) hypertension: Secondary | ICD-10-CM | POA: Diagnosis not present

## 2024-09-28 DIAGNOSIS — E7800 Pure hypercholesterolemia, unspecified: Secondary | ICD-10-CM | POA: Diagnosis not present

## 2024-09-28 DIAGNOSIS — I4891 Unspecified atrial fibrillation: Secondary | ICD-10-CM | POA: Diagnosis not present

## 2024-09-28 DIAGNOSIS — Z23 Encounter for immunization: Secondary | ICD-10-CM | POA: Diagnosis not present

## 2024-09-28 DIAGNOSIS — Z0000 Encounter for general adult medical examination without abnormal findings: Secondary | ICD-10-CM | POA: Diagnosis not present

## 2024-09-28 DIAGNOSIS — Z1331 Encounter for screening for depression: Secondary | ICD-10-CM | POA: Diagnosis not present

## 2024-09-29 DIAGNOSIS — D692 Other nonthrombocytopenic purpura: Secondary | ICD-10-CM | POA: Diagnosis not present

## 2024-10-07 DIAGNOSIS — G4733 Obstructive sleep apnea (adult) (pediatric): Secondary | ICD-10-CM | POA: Diagnosis not present

## 2024-10-07 DIAGNOSIS — E65 Localized adiposity: Secondary | ICD-10-CM | POA: Diagnosis not present

## 2024-10-07 DIAGNOSIS — R7303 Prediabetes: Secondary | ICD-10-CM | POA: Diagnosis not present

## 2024-10-07 DIAGNOSIS — G4731 Primary central sleep apnea: Secondary | ICD-10-CM | POA: Diagnosis not present

## 2024-10-07 DIAGNOSIS — I10 Essential (primary) hypertension: Secondary | ICD-10-CM | POA: Diagnosis not present

## 2024-10-09 ENCOUNTER — Ambulatory Visit
Admission: RE | Admit: 2024-10-09 | Discharge: 2024-10-09 | Disposition: A | Discharge status: Home / Self Care | Source: Ambulatory Visit | Attending: Adult Health | Admitting: Adult Health

## 2024-10-09 DIAGNOSIS — R972 Elevated prostate specific antigen [PSA]: Secondary | ICD-10-CM

## 2024-10-09 MED ORDER — GADOPICLENOL 0.5 MMOL/ML IV SOLN
10.0000 mL | Freq: Once | INTRAVENOUS | Status: AC | PRN
  Administered 2024-10-09: 9 mL via INTRAVENOUS

## 2024-10-14 DIAGNOSIS — K080 Exfoliation of teeth due to systemic causes: Secondary | ICD-10-CM | POA: Diagnosis not present

## 2024-10-23 DIAGNOSIS — E7800 Pure hypercholesterolemia, unspecified: Secondary | ICD-10-CM | POA: Diagnosis not present

## 2024-10-23 DIAGNOSIS — E6609 Other obesity due to excess calories: Secondary | ICD-10-CM | POA: Diagnosis not present

## 2024-10-23 DIAGNOSIS — F418 Other specified anxiety disorders: Secondary | ICD-10-CM | POA: Diagnosis not present

## 2024-10-23 DIAGNOSIS — I10 Essential (primary) hypertension: Secondary | ICD-10-CM | POA: Diagnosis not present

## 2024-10-23 DIAGNOSIS — I4891 Unspecified atrial fibrillation: Secondary | ICD-10-CM | POA: Diagnosis not present

## 2024-10-23 DIAGNOSIS — E785 Hyperlipidemia, unspecified: Secondary | ICD-10-CM | POA: Diagnosis not present

## 2024-11-22 DIAGNOSIS — E7800 Pure hypercholesterolemia, unspecified: Secondary | ICD-10-CM | POA: Diagnosis not present

## 2024-11-22 DIAGNOSIS — I10 Essential (primary) hypertension: Secondary | ICD-10-CM | POA: Diagnosis not present

## 2024-11-22 DIAGNOSIS — F418 Other specified anxiety disorders: Secondary | ICD-10-CM | POA: Diagnosis not present

## 2024-11-22 DIAGNOSIS — E785 Hyperlipidemia, unspecified: Secondary | ICD-10-CM | POA: Diagnosis not present

## 2024-11-22 DIAGNOSIS — I4891 Unspecified atrial fibrillation: Secondary | ICD-10-CM | POA: Diagnosis not present

## 2024-11-22 DIAGNOSIS — E6609 Other obesity due to excess calories: Secondary | ICD-10-CM | POA: Diagnosis not present

## 2024-12-01 DIAGNOSIS — G4731 Primary central sleep apnea: Secondary | ICD-10-CM | POA: Diagnosis not present

## 2024-12-01 DIAGNOSIS — I10 Essential (primary) hypertension: Secondary | ICD-10-CM | POA: Diagnosis not present

## 2024-12-01 DIAGNOSIS — E65 Localized adiposity: Secondary | ICD-10-CM | POA: Diagnosis not present

## 2024-12-01 DIAGNOSIS — R7303 Prediabetes: Secondary | ICD-10-CM | POA: Diagnosis not present

## 2024-12-02 DIAGNOSIS — L739 Follicular disorder, unspecified: Secondary | ICD-10-CM | POA: Diagnosis not present

## 2024-12-02 DIAGNOSIS — Z789 Other specified health status: Secondary | ICD-10-CM | POA: Diagnosis not present

## 2024-12-02 DIAGNOSIS — L820 Inflamed seborrheic keratosis: Secondary | ICD-10-CM | POA: Diagnosis not present

## 2024-12-02 DIAGNOSIS — L538 Other specified erythematous conditions: Secondary | ICD-10-CM | POA: Diagnosis not present

## 2024-12-27 NOTE — Progress Notes (Unsigned)
 " Cardiology Office Note:  .   Date:  12/28/2024  ID:  Michael Melendez, DOB September 28, 1951, MRN [id number] PCP: Rexanne Ingle, MD  [facility] Providers Cardiologist:  Gordy Bergamo, MD Electrophysiologist:  Will Gladis Norton, MD   History of Present Illness: Michael Melendez   RIVERS HAMRICK is a 74 y.o. Caucasian male patient with paroxysmal atrial fibrillation S/P ablation on 02/06/2024, hypercholesterolemia, hypertension, OSA on CPAP stopped after weight loss, coronary calcium  score (73 in the 47th percentile on 07/15/2023 presents for annual visit.   Patient went to what appears to be atrial fibrillation clinic at the old building in the hospital and got flustered and got hyper excited but was fortunately guided to our office this morning and he states that these episodes of panic and rage are getting more frequent and requests psychology referral.  Otherwise from cardiac standpoint he seems to be doing well with no recurrence of atrial fibrillation since A-fib ablation on 02/06/2024.    Discussed the use of AI scribe software for clinical note transcription with the patient, who gave verbal consent to proceed.  History of Present Illness Michael Melendez is a 74 year old male with atrial fibrillation who presents for follow-up regarding his cardiovascular health.  He has atrial fibrillation treated with ablation after electrical cardioversion provided only four days of benefit. He takes bisoprolol 200 mg twice daily and Eliquis  5 mg twice daily. He has had no recent atrial fibrillation episodes and notes better energy and resolved ankle swelling.  A sleep study showed low nocturnal oxygen saturation and he was started on CPAP, which initially helped. After significant weight loss he finds CPAP uncomfortable and has stopped using it.  He has high blood pressure that is well controlled on bisoprolol. He has not used spironolactone  for over a year and remains controlled without it. He is allergic to losartan.  He  has hypercholesterolemia treated with atorvastatin  10 mg taken two to three times per week plus ezetimibe . His LDL is 68 mg/dL. A prior calcium  score study showed vascular calcification.  He has episodes of intense rage associated with exhaustion despite psychological support and coping strategies. He continues to struggle with these episodes.  Cardiac Studies relevent.    Cardiac Studies & Procedures   ______________________________________________________________________________________________   MYOCARDIAL PERFUSION WO LEXISCAN  11/25/2019 Lexiscan  Tetrofosmin  stress test 11/25/2019: No previous exam available for comparison. Lexiscan  nuclear stress test performed using 1-day protocol. SPECT images show medium sized, medium intensity decreased tracer uptake, with mild reversibility, in basal inferior/inferoseptal myocardium s/o mild ischemia. Normal wall motion. Stress LVEF 62%. Intermediate risk stress test.   ECHOCARDIOGRAM COMPLETE 11/18/2019 Moderate concentric hypertrophy of the left ventricle. Left ventricle cavity is normal in size. Normal global wall motion. Normal LV systolic function with EF 55%. Doppler evidence of grade I (impaired) diastolic dysfunction, normal LAP. Left atrial cavity is mildly dilated. Mild (Grade I) mitral regurgitation. Mild tricuspid regurgitation. Estimated pulmonary artery systolic pressure is 24 mmHg.  ECHO TEE 02/11/2024 1. Left ventricular ejection fraction, by estimation, is 55%. The left ventricle has normal function. The left ventricle has no regional wall motion abnormalities. There is mild concentric left ventricular hypertrophy. 2. Right ventricular systolic function is normal. The right ventricular size is normal. Tricuspid regurgitation signal is inadequate for assessing PA pressure. 3. Left atrial size was moderately dilated. No left atrial/left atrial appendage thrombus was detected. 4. Right atrial size was mildly dilated. 5. The mitral  valve is normal in structure. Trivial mitral valve  regurgitation. 6. The aortic valve is tricuspid. Aortic valve regurgitation is not visualized. No aortic stenosis is present. 7. No PFO/ASD by color doppler.  CT CARDIAC SCORING (SELF PAY ONLY) 07/15/2023 No acute extracardiac findings. 1. Coronary calcium  score of 173. This was 47th percentile for age-, race-, and sex-matched controls. 2. 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  Labs   Recent Labs    01/03/24 1442 01/20/24 0955  NA 141 137  K 4.4 4.4  CL 107 103  CO2 26 22  GLUCOSE 86 109*  BUN 12 15  CREATININE 1.26* 1.37*  CALCIUM  8.8* 9.4  GFRNONAA 60* 54*    Lab Results  Component Value Date   ALT 23 01/03/2024   AST 24 01/03/2024   ALKPHOS 65 01/03/2024   BILITOT 0.4 01/03/2024      Latest Ref Rng & Units 02/06/2024   10:06 AM 01/03/2024    2:42 PM 10/23/2020    5:39 AM  CBC  WBC 3.4 - 10.8 x10E3/uL 6.7  7.8  3.8   Hemoglobin 13.0 - 17.7 g/dL 83.5  84.7  88.3   Hematocrit 37.5 - 51.0 % 49.4  45.2  34.5   Platelets 150 - 450 x10E3/uL 227  194  205    Lab Results  Component Value Date   TSH 2.114 01/03/2024    Care everywhere/Faxed External Labs:  Labs 03/22/2024:  A1c 5.9%.  Vitamin D25.3.  Labs 09/28/2024:  Hb 14.7/HCT 43.6, platelets 197, normal indicis.  Serum glucose 92 mg,.  23, creatinine 1.22, eGFR 62 mL, potassium 5.1, LFTs normal.  Total cholesterol 145, triglycerides 44, HDL 67, LDL 68.  ROS  Review of Systems  Cardiovascular:  Negative for chest pain, dyspnea on exertion and leg swelling.   Physical Exam:   VS:  BP 128/78 (BP Location: Left Arm, Patient Position: Sitting)   Pulse 60   Ht 5' 8 (1.727 m)   Wt 200 lb 6.4 oz (90.9 kg)   SpO2 98%   BMI 30.47 kg/m    Wt Readings from Last 3 Encounters:  12/28/24 200 lb 6.4 oz (90.9 kg)  09/07/24 197 lb 3.2 oz (89.4 kg)  04/02/24 225 lb 6.4 oz (102.2 kg)    BP Readings from Last 3 Encounters:  12/28/24 128/78  09/07/24 126/64   04/02/24 126/74   Physical Exam Neck:     Vascular: No carotid bruit or JVD.  Cardiovascular:     Rate and Rhythm: Normal rate and regular rhythm.     Pulses: Intact distal pulses.     Heart sounds: Normal heart sounds. No murmur heard.    No gallop.  Pulmonary:     Effort: Pulmonary effort is normal.     Breath sounds: Normal breath sounds.  Abdominal:     General: Bowel sounds are normal.     Palpations: Abdomen is soft.  Musculoskeletal:     Right lower leg: No edema.     Left lower leg: No edema.    EKG:    EKG Interpretation Date/Time:  Monday December 28 2024 08:13:42 EST Ventricular Rate:  60 PR Interval:  228 QRS Duration:  92 QT Interval:  402 QTC Calculation: 402 R Axis:   19  Text Interpretation: EKG 12/28/2024: Sinus rhythm with first degree block at rate of 60 bpm, poor R wave progression, probably normal variant.  No evidence of ischemia, normal QT interval.  No significant change from 09/07/2024. Confirmed by Gurley Climer, Jagadeesh ([id number]) on 12/28/2024 8:16:16 AM  ASSESSMENT AND PLAN: .      ICD-10-CM   1. Paroxysmal atrial fibrillation (HCC)  I48.0 EKG 12-Lead    2. Hypercoagulable state due to persistent atrial fibrillation (HCC)  D68.69    I48.19     3. Essential hypertension  I10     4. Hypercholesteremia  E78.00     5. Panic attack as reaction to stress  F41.0 Ambulatory referral to Psychology   F43.0      Assessment & Plan Paroxysmal atrial fibrillation Well-controlled post-ablation with no recent episodes. Energy levels have improved, and there is no ankle swelling. He is on Eliquis  for anticoagulation. - Continue Eliquis  5 mg twice a day.  Essential hypertension Blood pressure is well-controlled with bisoprolol. Spironolactone  was discontinued due to anaphylaxis and is not needed with current weight loss and controlled blood pressure. - Continue bisoprolol 200 mg twice a day.  Hypercholesterolemia Cholesterol levels are well-controlled  with atorvastatin  and ezetimibe . LDL is at 68, which is satisfactory. He is not diabetic and does not smoke, reducing the risk of soft plaque. - Continue atorvastatin  10 mg daily. - Continue ezetimibe  as prescribed.  Obstructive sleep apnea Previously managed with CPAP, which he discontinued due to weight loss and discomfort. Sleep quality is reported as excellent by Apple Watch, but follow-up with a sleep specialist is recommended to assess for residual sleep apnea, as it can affect atrial fibrillation recurrence. - Follow up with sleep specialist to assess for residual sleep apnea.  Panic disorder Experiences episodes of severe anxiety and rage, which are difficult to manage. He is seeing a psychologist and finds it helpful.    Follow up: 1 Year  Signed,  Gordy Bergamo, MD, [facility] 12/28/2024, 10:50 AM [facility] [address] [place], [place] [address] Phone: [phone]. Fax:  [phone]  "

## 2024-12-28 ENCOUNTER — Encounter: Payer: Self-pay | Admitting: Cardiology

## 2024-12-28 ENCOUNTER — Ambulatory Visit: Attending: Cardiology | Admitting: Cardiology

## 2024-12-28 VITALS — BP 128/78 | HR 60 | Ht 68.0 in | Wt 200.4 lb

## 2024-12-28 DIAGNOSIS — I10 Essential (primary) hypertension: Secondary | ICD-10-CM

## 2024-12-28 DIAGNOSIS — F430 Acute stress reaction: Secondary | ICD-10-CM

## 2024-12-28 DIAGNOSIS — D6869 Other thrombophilia: Secondary | ICD-10-CM | POA: Diagnosis not present

## 2024-12-28 DIAGNOSIS — E7800 Pure hypercholesterolemia, unspecified: Secondary | ICD-10-CM

## 2024-12-28 DIAGNOSIS — I4819 Other persistent atrial fibrillation: Secondary | ICD-10-CM | POA: Diagnosis not present

## 2024-12-28 DIAGNOSIS — I480 Paroxysmal atrial fibrillation: Secondary | ICD-10-CM

## 2024-12-28 DIAGNOSIS — F410 Panic disorder [episodic paroxysmal anxiety] without agoraphobia: Secondary | ICD-10-CM | POA: Diagnosis not present

## 2024-12-28 NOTE — Patient Instructions (Addendum)
 Medication Instructions:  Your physician recommends that you continue on your current medications as directed. Please refer to the Current Medication list given to you today.  *If you need a refill on your cardiac medications before your next appointment, please call your pharmacy*   Follow-Up: At [facility], you and your health needs are our priority.  As part of our continuing mission to provide you with exceptional heart care, our providers are all part of one team.  This team includes your primary Cardiologist (physician) and Advanced Practice Providers or APPs (Physician Assistants and Nurse Practitioners) who all work together to provide you with the care you need, when you need it.  Your next appointment:   1 year(s)  Provider:   Gordy Bergamo, MD        We recommend signing up for the patient portal called MyChart.  Patients are able to view lab/test results, encounter notes, upcoming appointments, etc.  Non-urgent messages can be sent to your provider as well, go to [email or web address].

## 2025-01-29 ENCOUNTER — Ambulatory Visit: Admitting: Professional

## 2025-02-18 ENCOUNTER — Ambulatory Visit: Admitting: Professional
